# Patient Record
Sex: Female | Born: 1950 | Race: White | Hispanic: No | Marital: Single | Smoking: Former smoker
Health system: Southern US, Community
[De-identification: ages and names within clinical notes are randomized; demographics above are authoritative.]

## PROBLEM LIST (undated history)

## (undated) DIAGNOSIS — E039 Hypothyroidism, unspecified: Secondary | ICD-10-CM

## (undated) DIAGNOSIS — G4730 Sleep apnea, unspecified: Secondary | ICD-10-CM

## (undated) DIAGNOSIS — Z79890 Hormone replacement therapy (postmenopausal): Secondary | ICD-10-CM

## (undated) DIAGNOSIS — G4700 Insomnia, unspecified: Secondary | ICD-10-CM

## (undated) DIAGNOSIS — K219 Gastro-esophageal reflux disease without esophagitis: Secondary | ICD-10-CM

## (undated) HISTORY — DX: Insomnia, unspecified: G47.00

## (undated) HISTORY — DX: Gastro-esophageal reflux disease without esophagitis: K21.9

## (undated) HISTORY — DX: Hypothyroidism, unspecified: E03.9

## (undated) HISTORY — DX: Sleep apnea, unspecified: G47.30

## (undated) HISTORY — DX: Hormone replacement therapy: Z79.890

---

## 1998-03-08 ENCOUNTER — Other Ambulatory Visit: Admission: RE | Admit: 1998-03-08 | Discharge: 1998-03-08 | Payer: Self-pay | Admitting: Gynecology

## 1998-11-28 ENCOUNTER — Other Ambulatory Visit: Admission: RE | Admit: 1998-11-28 | Discharge: 1998-11-28 | Payer: Self-pay | Admitting: Gynecology

## 1998-12-25 ENCOUNTER — Other Ambulatory Visit: Admission: RE | Admit: 1998-12-25 | Discharge: 1998-12-25 | Payer: Self-pay | Admitting: Gynecology

## 1999-01-12 ENCOUNTER — Other Ambulatory Visit: Admission: RE | Admit: 1999-01-12 | Discharge: 1999-01-12 | Payer: Self-pay | Admitting: Gynecology

## 1999-04-25 ENCOUNTER — Other Ambulatory Visit: Admission: RE | Admit: 1999-04-25 | Discharge: 1999-04-25 | Payer: Self-pay | Admitting: Gynecology

## 1999-08-16 ENCOUNTER — Other Ambulatory Visit: Admission: RE | Admit: 1999-08-16 | Discharge: 1999-08-16 | Payer: Self-pay | Admitting: Gynecology

## 1999-12-10 ENCOUNTER — Other Ambulatory Visit: Admission: RE | Admit: 1999-12-10 | Discharge: 1999-12-10 | Payer: Self-pay | Admitting: Gynecology

## 1999-12-18 ENCOUNTER — Other Ambulatory Visit: Admission: RE | Admit: 1999-12-18 | Discharge: 1999-12-18 | Payer: Self-pay | Admitting: Gynecology

## 1999-12-18 ENCOUNTER — Encounter (INDEPENDENT_AMBULATORY_CARE_PROVIDER_SITE_OTHER): Payer: Self-pay | Admitting: Specialist

## 2000-05-08 ENCOUNTER — Other Ambulatory Visit: Admission: RE | Admit: 2000-05-08 | Discharge: 2000-05-08 | Payer: Self-pay | Admitting: Gynecology

## 2000-12-10 ENCOUNTER — Other Ambulatory Visit: Admission: RE | Admit: 2000-12-10 | Discharge: 2000-12-10 | Payer: Self-pay | Admitting: Gynecology

## 2002-03-09 ENCOUNTER — Other Ambulatory Visit: Admission: RE | Admit: 2002-03-09 | Discharge: 2002-03-09 | Payer: Self-pay | Admitting: Gynecology

## 2003-03-08 ENCOUNTER — Other Ambulatory Visit: Admission: RE | Admit: 2003-03-08 | Discharge: 2003-03-08 | Payer: Self-pay | Admitting: Gynecology

## 2003-04-13 ENCOUNTER — Encounter: Payer: Self-pay | Admitting: Gynecology

## 2003-04-13 ENCOUNTER — Ambulatory Visit (HOSPITAL_COMMUNITY): Admission: RE | Admit: 2003-04-13 | Discharge: 2003-04-13 | Payer: Self-pay | Admitting: Gynecology

## 2004-03-20 ENCOUNTER — Other Ambulatory Visit: Admission: RE | Admit: 2004-03-20 | Discharge: 2004-03-20 | Payer: Self-pay | Admitting: Gynecology

## 2004-04-18 ENCOUNTER — Ambulatory Visit (HOSPITAL_COMMUNITY): Admission: RE | Admit: 2004-04-18 | Discharge: 2004-04-18 | Payer: Self-pay | Admitting: Gynecology

## 2005-05-06 ENCOUNTER — Other Ambulatory Visit: Admission: RE | Admit: 2005-05-06 | Discharge: 2005-05-06 | Payer: Self-pay | Admitting: Gynecology

## 2005-05-28 ENCOUNTER — Ambulatory Visit (HOSPITAL_COMMUNITY): Admission: RE | Admit: 2005-05-28 | Discharge: 2005-05-28 | Payer: Self-pay | Admitting: Gynecology

## 2006-04-16 ENCOUNTER — Other Ambulatory Visit: Admission: RE | Admit: 2006-04-16 | Discharge: 2006-04-16 | Payer: Self-pay | Admitting: Family Medicine

## 2006-06-02 ENCOUNTER — Ambulatory Visit (HOSPITAL_COMMUNITY): Admission: RE | Admit: 2006-06-02 | Discharge: 2006-06-02 | Payer: Self-pay | Admitting: Family Medicine

## 2007-01-26 ENCOUNTER — Encounter: Admission: RE | Admit: 2007-01-26 | Discharge: 2007-01-26 | Payer: Self-pay | Admitting: Sports Medicine

## 2007-06-24 ENCOUNTER — Other Ambulatory Visit: Admission: RE | Admit: 2007-06-24 | Discharge: 2007-06-24 | Payer: Self-pay | Admitting: Family Medicine

## 2007-07-07 ENCOUNTER — Ambulatory Visit (HOSPITAL_COMMUNITY): Admission: RE | Admit: 2007-07-07 | Discharge: 2007-07-07 | Payer: Self-pay | Admitting: Family Medicine

## 2008-07-07 ENCOUNTER — Ambulatory Visit (HOSPITAL_COMMUNITY): Admission: RE | Admit: 2008-07-07 | Discharge: 2008-07-07 | Payer: Self-pay | Admitting: Family Medicine

## 2008-09-20 ENCOUNTER — Other Ambulatory Visit: Admission: RE | Admit: 2008-09-20 | Discharge: 2008-09-20 | Payer: Self-pay | Admitting: Family Medicine

## 2009-04-26 ENCOUNTER — Other Ambulatory Visit: Admission: RE | Admit: 2009-04-26 | Discharge: 2009-04-26 | Payer: Self-pay | Admitting: Family Medicine

## 2009-06-21 ENCOUNTER — Encounter (INDEPENDENT_AMBULATORY_CARE_PROVIDER_SITE_OTHER): Payer: Self-pay | Admitting: *Deleted

## 2009-10-17 ENCOUNTER — Ambulatory Visit (HOSPITAL_COMMUNITY): Admission: RE | Admit: 2009-10-17 | Discharge: 2009-10-17 | Payer: Self-pay | Admitting: Family Medicine

## 2009-11-07 ENCOUNTER — Other Ambulatory Visit: Admission: RE | Admit: 2009-11-07 | Discharge: 2009-11-07 | Payer: Self-pay | Admitting: Family Medicine

## 2010-03-13 ENCOUNTER — Telehealth: Payer: Self-pay | Admitting: Gastroenterology

## 2010-09-15 ENCOUNTER — Encounter: Payer: Self-pay | Admitting: Gynecology

## 2010-09-25 NOTE — Progress Notes (Signed)
Summary: Schedule Colonoscopy  Phone Note Outgoing Call Call back at [facility] Phone [phone]   Call placed by: Harlow Mares CMA [facility]),  March 13, 2010 10:11 AM Call placed to: Patient Summary of Call: Patients number is disconnected, we will mail them a letter to remind them they are due for their procedure and they need to call back and schedule.   Initial call taken by: Harlow Mares CMA [facility]),  March 13, 2010 10:11 AM

## 2010-11-05 ENCOUNTER — Other Ambulatory Visit (HOSPITAL_COMMUNITY): Payer: Self-pay | Admitting: Family Medicine

## 2010-11-05 DIAGNOSIS — Z1231 Encounter for screening mammogram for malignant neoplasm of breast: Secondary | ICD-10-CM

## 2010-11-20 ENCOUNTER — Ambulatory Visit (HOSPITAL_COMMUNITY): Payer: BC Managed Care – PPO | Attending: Family Medicine

## 2010-12-12 ENCOUNTER — Other Ambulatory Visit (HOSPITAL_COMMUNITY)
Admission: RE | Admit: 2010-12-12 | Discharge: 2010-12-12 | Disposition: A | Discharge status: Home / Self Care | Payer: BC Managed Care – PPO | Source: Ambulatory Visit | Attending: Family Medicine | Admitting: Family Medicine

## 2010-12-12 ENCOUNTER — Other Ambulatory Visit: Payer: Self-pay | Admitting: Family Medicine

## 2010-12-12 DIAGNOSIS — Z124 Encounter for screening for malignant neoplasm of cervix: Secondary | ICD-10-CM | POA: Insufficient documentation

## 2011-04-10 ENCOUNTER — Ambulatory Visit (HOSPITAL_COMMUNITY)
Admission: RE | Admit: 2011-04-10 | Discharge: 2011-04-10 | Disposition: A | Discharge status: Home / Self Care | Payer: BC Managed Care – PPO | Source: Ambulatory Visit | Attending: Family Medicine | Admitting: Family Medicine

## 2011-04-10 DIAGNOSIS — Z1231 Encounter for screening mammogram for malignant neoplasm of breast: Secondary | ICD-10-CM

## 2011-04-12 ENCOUNTER — Other Ambulatory Visit: Payer: Self-pay | Admitting: Family Medicine

## 2011-04-12 DIAGNOSIS — R928 Other abnormal and inconclusive findings on diagnostic imaging of breast: Secondary | ICD-10-CM

## 2011-04-19 ENCOUNTER — Ambulatory Visit
Admission: RE | Admit: 2011-04-19 | Discharge: 2011-04-19 | Disposition: A | Discharge status: Home / Self Care | Payer: BC Managed Care – PPO | Source: Ambulatory Visit | Attending: Family Medicine | Admitting: Family Medicine

## 2011-04-19 DIAGNOSIS — R928 Other abnormal and inconclusive findings on diagnostic imaging of breast: Secondary | ICD-10-CM

## 2012-01-08 ENCOUNTER — Encounter: Payer: Self-pay | Admitting: Gastroenterology

## 2012-10-29 ENCOUNTER — Other Ambulatory Visit: Payer: Self-pay

## 2012-10-29 DIAGNOSIS — Z1231 Encounter for screening mammogram for malignant neoplasm of breast: Secondary | ICD-10-CM

## 2012-12-02 ENCOUNTER — Other Ambulatory Visit (HOSPITAL_COMMUNITY)
Admission: RE | Admit: 2012-12-02 | Discharge: 2012-12-02 | Disposition: A | Discharge status: Home / Self Care | Payer: BC Managed Care – PPO | Source: Ambulatory Visit | Attending: Family Medicine | Admitting: Family Medicine

## 2012-12-02 ENCOUNTER — Other Ambulatory Visit: Payer: Self-pay | Admitting: Family Medicine

## 2012-12-02 DIAGNOSIS — Z0000 Encounter for general adult medical examination without abnormal findings: Secondary | ICD-10-CM | POA: Insufficient documentation

## 2012-12-04 ENCOUNTER — Ambulatory Visit
Admission: RE | Admit: 2012-12-04 | Discharge: 2012-12-04 | Disposition: A | Discharge status: Home / Self Care | Payer: BC Managed Care – PPO | Source: Ambulatory Visit

## 2012-12-04 DIAGNOSIS — Z1231 Encounter for screening mammogram for malignant neoplasm of breast: Secondary | ICD-10-CM

## 2014-02-02 ENCOUNTER — Other Ambulatory Visit: Payer: Self-pay | Admitting: Family Medicine

## 2014-02-02 ENCOUNTER — Other Ambulatory Visit: Payer: Self-pay

## 2014-02-02 DIAGNOSIS — Z1231 Encounter for screening mammogram for malignant neoplasm of breast: Secondary | ICD-10-CM

## 2014-03-02 ENCOUNTER — Other Ambulatory Visit: Payer: Self-pay | Admitting: Dermatology

## 2014-03-02 ENCOUNTER — Ambulatory Visit: Payer: BC Managed Care – PPO

## 2014-03-04 ENCOUNTER — Ambulatory Visit: Payer: BC Managed Care – PPO

## 2014-04-26 ENCOUNTER — Ambulatory Visit
Admission: RE | Admit: 2014-04-26 | Discharge: 2014-04-26 | Disposition: A | Discharge status: Home / Self Care | Payer: BC Managed Care – PPO | Source: Ambulatory Visit | Attending: Family Medicine | Admitting: Family Medicine

## 2014-04-26 DIAGNOSIS — Z1231 Encounter for screening mammogram for malignant neoplasm of breast: Secondary | ICD-10-CM

## 2014-07-18 ENCOUNTER — Other Ambulatory Visit: Payer: Self-pay | Admitting: Gastroenterology

## 2015-04-04 ENCOUNTER — Other Ambulatory Visit: Payer: Self-pay

## 2015-04-04 DIAGNOSIS — Z1231 Encounter for screening mammogram for malignant neoplasm of breast: Secondary | ICD-10-CM

## 2015-05-29 ENCOUNTER — Ambulatory Visit
Admission: RE | Admit: 2015-05-29 | Discharge: 2015-05-29 | Disposition: A | Discharge status: Home / Self Care | Payer: BLUE CROSS/BLUE SHIELD | Source: Ambulatory Visit

## 2015-05-29 DIAGNOSIS — Z1231 Encounter for screening mammogram for malignant neoplasm of breast: Secondary | ICD-10-CM

## 2016-01-15 ENCOUNTER — Other Ambulatory Visit: Payer: Self-pay | Admitting: Family Medicine

## 2016-01-15 DIAGNOSIS — R5381 Other malaise: Secondary | ICD-10-CM

## 2016-01-29 ENCOUNTER — Other Ambulatory Visit: Payer: Self-pay | Admitting: Family Medicine

## 2016-01-29 DIAGNOSIS — E2839 Other primary ovarian failure: Secondary | ICD-10-CM

## 2016-02-15 ENCOUNTER — Other Ambulatory Visit: Payer: BLUE CROSS/BLUE SHIELD

## 2016-02-20 ENCOUNTER — Ambulatory Visit
Admission: RE | Admit: 2016-02-20 | Discharge: 2016-02-20 | Disposition: A | Discharge status: Home / Self Care | Payer: BLUE CROSS/BLUE SHIELD | Source: Ambulatory Visit | Attending: Family Medicine | Admitting: Family Medicine

## 2016-02-20 DIAGNOSIS — E2839 Other primary ovarian failure: Secondary | ICD-10-CM

## 2016-03-15 ENCOUNTER — Encounter (INDEPENDENT_AMBULATORY_CARE_PROVIDER_SITE_OTHER): Payer: Self-pay | Admitting: Ophthalmology

## 2016-04-11 ENCOUNTER — Encounter (INDEPENDENT_AMBULATORY_CARE_PROVIDER_SITE_OTHER): Payer: BLUE CROSS/BLUE SHIELD | Admitting: Ophthalmology

## 2016-10-03 DIAGNOSIS — H40013 Open angle with borderline findings, low risk, bilateral: Secondary | ICD-10-CM | POA: Diagnosis not present

## 2016-10-03 DIAGNOSIS — M3501 Sicca syndrome with keratoconjunctivitis: Secondary | ICD-10-CM | POA: Diagnosis not present

## 2016-10-03 DIAGNOSIS — H04123 Dry eye syndrome of bilateral lacrimal glands: Secondary | ICD-10-CM | POA: Diagnosis not present

## 2016-10-14 DIAGNOSIS — H43813 Vitreous degeneration, bilateral: Secondary | ICD-10-CM | POA: Diagnosis not present

## 2016-10-14 DIAGNOSIS — H35432 Paving stone degeneration of retina, left eye: Secondary | ICD-10-CM | POA: Diagnosis not present

## 2016-10-14 DIAGNOSIS — H35371 Puckering of macula, right eye: Secondary | ICD-10-CM | POA: Diagnosis not present

## 2016-10-14 DIAGNOSIS — H43393 Other vitreous opacities, bilateral: Secondary | ICD-10-CM | POA: Diagnosis not present

## 2016-10-28 ENCOUNTER — Ambulatory Visit (INDEPENDENT_AMBULATORY_CARE_PROVIDER_SITE_OTHER): Payer: PPO | Admitting: Neurology

## 2016-10-28 ENCOUNTER — Encounter: Payer: Self-pay | Admitting: Neurology

## 2016-10-28 VITALS — BP 108/64 | HR 74 | Resp 14 | Ht 62.0 in | Wt 126.0 lb

## 2016-10-28 DIAGNOSIS — R351 Nocturia: Secondary | ICD-10-CM

## 2016-10-28 DIAGNOSIS — G479 Sleep disorder, unspecified: Secondary | ICD-10-CM | POA: Diagnosis not present

## 2016-10-28 DIAGNOSIS — R0681 Apnea, not elsewhere classified: Secondary | ICD-10-CM

## 2016-10-28 DIAGNOSIS — G2581 Restless legs syndrome: Secondary | ICD-10-CM | POA: Diagnosis not present

## 2016-10-28 DIAGNOSIS — R0683 Snoring: Secondary | ICD-10-CM | POA: Diagnosis not present

## 2016-10-28 NOTE — Patient Instructions (Signed)

## 2016-10-28 NOTE — Progress Notes (Signed)
Subjective:    Patient ID: Wendy Elliott is a 66 y.o. female.  HPI     Star Age, MD, PhD [facility] [address], [address] [place], [place] [address]  Dear Dr. Milana Kidney,    I saw your patient, Wendy Elliott, upon your kind request in my neurologic clinic today for initial consultation of her sleep disorder, in particular, concern for underlying obstructive sleep apnea. The patient is unaccompanied today. As you know, Wendy Elliott is a 66 year old right-handed woman with an underlying medical history of glaucoma, hypothyroidism, and postmenopausal hormone replacement therapy, who reports snoring and excessive daytime somnolence as well as waking up with a sense of choking at night. I reviewed your office note from 10/03/2016, which you kindly included.  Her Epworth sleepiness score is 8 out of 24 today, her fatigue score is 26 out of 63. She reports loud snoring and she has woken herself with a sense of gasping for air. She has been told by boyfriends in the past that she snores loudly. She is not aware of any family history of OSA. She suffers from occasional restless leg symptoms which are annoying, these are not night to night. She is a very restless sleeper, she snores worse on her back, she has nocturia once per night on average. She tries to be in bed between 10 and 11 and her wakeup time 7 AM, she falls asleep okay but does have difficulty staying asleep and often is up after 4 hours and has difficulty going back to sleep. She has tried over-the-counter p.m. type medication or Advil or NyQuil. She has even tried prescription medication including Xanax, clonazepam, trazodone and Ambien, she had side effects with the medications. She is no longer in any sleep aid prescription or otherwise. She drinks alcohol occasionally, usually wine, she does not smoke any longer, quit about 15 years ago, she is divorced for many years, she has no children or pets. She drinks  caffeine occasionally, definitely not daily. 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 She does not like to nap, feels worse after napping.  Her Past Medical History Is Significant For: Past Medical History:  Diagnosis Date  . Hormone replacement therapy   . Hypothyroidism   . Insomnia   . Sleep apnea     Her Past Surgical History Is Significant For: No past surgical history on file.  Her Family History Is Significant For: Family History  Problem Relation Age of Onset  . Osteoporosis Mother     Her Social History Is Significant For: Social History   Social History  . Marital status: Single    Spouse name: N/A  . Number of children: 0  . Years of education: 84   Occupational History  . Self Employeed     Social History Main Topics  . Smoking status: Former Research scientist (life sciences)  . Smokeless tobacco: Never Used     Comment: Quit about 2000  . Alcohol use Yes  . Drug use: No  . Sexual activity: Not Asked   Other Topics Concern  . None   Social History Narrative   Rarely drinks caffeine     Her Allergies Are:  No Known Allergies:   Her Current Medications Are:  Outpatient Encounter Prescriptions as of 10/28/2016  Medication Sig  . fluorometholone (FML) 0.1 % ophthalmic suspension 1 drop every 4 (four) hours.  Marland Kitchen latanoprost (XALATAN) 0.005 % ophthalmic solution 1 drop at bedtime.  Marland Kitchen  Probiotic Product (PROBIOTIC PO) Take by mouth.  . Progesterone 40 % CREA by Does not apply route.  . Testosterone 20 % CREA by Does not apply route.  . [DISCONTINUED] travoprost, benzalkonium, (TRAVATAN) 0.004 % ophthalmic solution 1 drop at bedtime.   No facility-administered encounter medications on file as of 10/28/2016.   :  Review of Systems:  Out of a complete 14 point review of systems, all are reviewed and negative with the exception of these symptoms as listed below: Review of Systems  Neurological:       Patient states that she has trouble  staying asleep after 4 hours, snores, wakes up choking, sometimes wakes up feeling tired, has some daytime fatigue.   Patient has used a mouth guard in the past which helped with the snoring but caused dental issues.    Epworth Sleepiness Scale 0= would never doze 1= slight chance of dozing 2= moderate chance of dozing 3= high chance of dozing  Sitting and reading:3 Watching TV:2 Sitting inactive in a public place (ex. Theater or meeting):1 As a passenger in a car for an hour without a break:1 Lying down to rest in the afternoon:1 Sitting and talking to someone:0 Sitting quietly after lunch (no alcohol):0 In a car, while stopped in traffic:0 Total:8  Objective:  Neurologic Exam  Physical Exam Physical Examination:   Vitals:   10/28/16 1540  BP: 108/64  Pulse: 74  Resp: 14    General Examination: The patient is a very pleasant 66 y.o. female in no acute distress. She appears well-developed and well-nourished and very well groomed.   HEENT: Normocephalic, atraumatic, pupils are equal, round and reactive to light and accommodation. Funduscopic exam is normal with sharp disc margins noted. Extraocular tracking is good without limitation to gaze excursion or nystagmus noted. Normal smooth pursuit is noted. Hearing is grossly intact. Tympanic membranes are clear bilaterally. Face is symmetric with normal facial animation and normal facial sensation. Speech is clear with no dysarthria noted. There is no hypophonia. There is no lip, neck/head, jaw or voice tremor. Neck is supple with full range of passive and active motion. There are no carotid bruits on auscultation. Oropharynx exam reveals: mild mouth dryness, good dental hygiene and dental implants, and mild airway crowding, due to smaller airway entry and thick her uvula. She has mild pharyngeal erythema and does admit to having a raspy throat today, Mallampati is class I. Tongue protrudes centrally and palate elevates symmetrically,  tonsils are small in size, neck is a slender 13-1/4 inch circumference. She has a mild overbite.  Chest: Clear to auscultation without wheezing, rhonchi or crackles noted.  Heart: S1+S2+0, regular and normal without murmurs, rubs or gallops noted.   Abdomen: Soft, non-tender and non-distended with normal bowel sounds appreciated on auscultation.  Extremities: There is no pitting edema in the distal lower extremities bilaterally. Pedal pulses are intact.  Skin: Warm and dry without trophic changes noted.  Musculoskeletal: exam reveals no obvious joint deformities, tenderness or joint swelling or erythema.   Neurologically:  Mental status: The patient is awake, alert and oriented in all 4 spheres. Her immediate and remote memory, attention, language skills and fund of knowledge are appropriate. There is no evidence of aphasia, agnosia, apraxia or anomia. Speech is clear with normal prosody and enunciation. Thought process is linear. Mood is normal and affect is normal.  Cranial nerves II - XII are as described above under HEENT exam. In addition: shoulder shrug is normal with equal shoulder height  noted. Motor exam: Normal bulk, strength and tone is noted. There is no drift, tremor or rebound. Romberg is negative. Reflexes are 2+ throughout. Fine motor skills and coordination: intact with normal finger taps, normal hand movements, normal rapid alternating patting, normal foot taps and normal foot agility.  Cerebellar testing: No dysmetria or intention tremor on finger to nose testing. Heel to shin is unremarkable bilaterally. There is no truncal or gait ataxia.  Sensory exam: intact to light touch, vibration, temperature sense in the upper and lower extremities.  Gait, station and balance: She stands easily. No veering to one side is noted. No leaning to one side is noted. Posture is age-appropriate and stance is narrow based. Gait shows normal stride length and decreased pace. No problems turning  are noted. Tandem walk is unremarkable.  Assessment and Plan:  In summary, Wendy Elliott is a very pleasant 66 y.o.-year old female with an underlying medical history of glaucoma, hypothyroidism, and postmenopausal hormone replacement therapy, whose history and physical exam are concerning for obstructive sleep apnea (OSA), In that she reports restless sleep, nonrestorative sleep, sleep maintenance issues, loud snoring and waking up with a sense of gasping for air, and nocturia. I had a long chat with the patient about my findings and the diagnosis of OSA, its prognosis and treatment options. We talked about medical treatments, surgical interventions and non-pharmacological approaches. I explained in particular the risks and ramifications of untreated moderate to severe OSA, especially with respect to developing cardiovascular disease down the Road, including congestive heart failure, difficult to treat hypertension, cardiac arrhythmias, or stroke. Even type 2 diabetes has, in part, been linked to untreated OSA. Symptoms of untreated OSA include daytime sleepiness, memory problems, mood irritability and mood disorder such as depression and anxiety, lack of energy, as well as recurrent headaches, especially morning headaches. We talked about trying to maintain a healthy lifestyle in general, as well as the importance of weight control, she does not need to lose weight at this time. I advised the patient not to drive when feeling sleepy.  I recommended the following at this time: sleep study with potential positive airway pressure titration. (We will score hypopneas at 4%).   I explained the sleep test procedure to the patient and also outlined possible surgical and non-surgical treatment options of OSA, including the use of a custom-made dental device (which would require a referral to a specialist dentist or oral surgeon), upper airway surgical options, such as pillar implants, radiofrequency surgery, tongue  base surgery, and UPPP (which would involve a referral to an ENT surgeon). Rarely, jaw surgery such as mandibular advancement may be considered. Of note, she had issues with an over-the-counter oral appliance and had dental implants, her current dentist advised her against a oral appliance for treatment of snoring or sleep apnea. I also explained the CPAP treatment option to the patient, who indicated that she would be willing to try CPAP if the need arises. I explained the importance of being compliant with PAP treatment, not only for insurance purposes but primarily to improve Her symptoms, and for the patient's long term health benefit, including to reduce Her cardiovascular risks. I answered all her questions today and the patient was in agreement. I would like to see her back after the sleep study is completed and encouraged her to call with any interim questions, concerns, problems or updates.   Thank you very much for allowing me to participate in the care of this nice patient. If I can  be of any further assistance to you please do not hesitate to call me at [phone].  Sincerely,   Star Age, MD, PhD

## 2016-11-20 DIAGNOSIS — H40003 Preglaucoma, unspecified, bilateral: Secondary | ICD-10-CM | POA: Diagnosis not present

## 2016-12-17 ENCOUNTER — Encounter: Payer: Self-pay | Admitting: Neurology

## 2016-12-26 DIAGNOSIS — H40003 Preglaucoma, unspecified, bilateral: Secondary | ICD-10-CM | POA: Diagnosis not present

## 2016-12-26 DIAGNOSIS — H2512 Age-related nuclear cataract, left eye: Secondary | ICD-10-CM | POA: Diagnosis not present

## 2016-12-26 DIAGNOSIS — H4051X2 Glaucoma secondary to other eye disorders, right eye, moderate stage: Secondary | ICD-10-CM | POA: Diagnosis not present

## 2016-12-26 DIAGNOSIS — Z961 Presence of intraocular lens: Secondary | ICD-10-CM | POA: Diagnosis not present

## 2016-12-26 DIAGNOSIS — H40009 Preglaucoma, unspecified, unspecified eye: Secondary | ICD-10-CM | POA: Diagnosis not present

## 2016-12-26 DIAGNOSIS — H40001 Preglaucoma, unspecified, right eye: Secondary | ICD-10-CM | POA: Diagnosis not present

## 2016-12-26 DIAGNOSIS — Z9841 Cataract extraction status, right eye: Secondary | ICD-10-CM | POA: Diagnosis not present

## 2017-01-01 DIAGNOSIS — L258 Unspecified contact dermatitis due to other agents: Secondary | ICD-10-CM | POA: Diagnosis not present

## 2017-01-27 ENCOUNTER — Ambulatory Visit (INDEPENDENT_AMBULATORY_CARE_PROVIDER_SITE_OTHER): Payer: PPO | Admitting: Neurology

## 2017-01-27 DIAGNOSIS — G4720 Circadian rhythm sleep disorder, unspecified type: Secondary | ICD-10-CM

## 2017-01-27 DIAGNOSIS — G4733 Obstructive sleep apnea (adult) (pediatric): Secondary | ICD-10-CM | POA: Diagnosis not present

## 2017-01-29 NOTE — Progress Notes (Signed)
Patient referred by Dr. Milana Kidney, seen by me on 10/28/16, diagnostic PSG on 01/27/17.    Please call and notify the patient that the recent sleep study did confirm the diagnosis of Moderate obstructive sleep apnea and that I recommend treatment for this in the form of CPAP. This will require a repeat sleep study for proper titration and mask fitting. Please explain to patient and arrange for a CPAP titration study. I have placed an order in the chart. Thanks, and please route to [facility] for scheduling next sleep study.  Star Age, MD, PhD [facility])

## 2017-01-29 NOTE — Procedures (Signed)
PATIENT'S NAME:  Wendy Elliott, Wendy Elliott DOB:      27-Jan-1951      MR#:    [id number]     DATE OF RECORDING: 01/27/2017 REFERRING M.D.:  Thora Lance, MD Study Performed:   Baseline Polysomnogram HISTORY: 66 year old woman with a history of glaucoma, hypothyroidism, and postmenopausal hormone replacement therapy, who reports snoring and excessive daytime somnolence as well as waking up with a sense of choking at night. The patient endorsed the Epworth Sleepiness Scale at 8/24 points. The patient's weight 126 pounds with a height of 62 (inches), resulting in a BMI of 23.1 kg/m2. The patient's neck circumference measured 13 inches.  CURRENT MEDICATIONS: FML, Xalatan, Probiotic, Progesterone, Testosterone.   PROCEDURE:  This is a multichannel digital polysomnogram utilizing the Somnostar 11.2 system.  Electrodes and sensors were applied and monitored per AASM Specifications.   EEG, EOG, Chin and Limb EMG, were sampled at 200 Hz.  ECG, Snore and Nasal Pressure, Thermal Airflow, Respiratory Effort, CPAP Flow and Pressure, Oximetry was sampled at 50 Hz. Digital video and audio were recorded.      BASELINE STUDY  Lights Out was at 22:52 and Lights On at 05:18.  Total recording time (TRT) was 386.5 minutes, with a total sleep time (TST) of  251.5 minutes. The patient's sleep latency was 13 minutes, which is normal. REM latency was 88 minutes, which is normal. The sleep efficiency was 65.1 %, which is reduced.     SLEEP ARCHITECTURE: WASO (Wake after sleep onset) was 104 minutes with moderate sleep fragmentation noted and one longer period of wakefulness. There were 27.5 minutes in Stage N1, 110 minutes Stage N2, 89 minutes Stage N3 and 25 minutes in Stage REM.  The percentage of Stage N1 was 10.9%, which is increased, Stage N2 was 43.7%, Stage N3 was 35.4%, which is increased, and Stage R (REM sleep) was 9.9% with is reduced.  The arousals were noted as: 38 were spontaneous, 4 were associated with PLMs, 26 were  associated with respiratory events.  Audio and video analysis did not show any abnormal or unusual movements, behaviors, phonations or vocalizations. The patient took no bathroom breaks. Mild to moderate snoring was noted. The EKG was in keeping with normal sinus rhythm (NSR).  RESPIRATORY ANALYSIS:  There were a total of 64 respiratory events:  7 obstructive apneas, 33 central apneas and 0 mixed apneas with a total of 40 apneas and an apnea index (AI) of 9.5 /hour. There were 24 hypopneas with a hypopnea index of 5.7 /hour. The patient also had 1 respiratory event related arousals (RERAs).      The total APNEA/HYPOPNEA INDEX (AHI) was 15.3/hour and the total RESPIRATORY DISTURBANCE INDEX was 15.5 /hour.  8 events occurred in REM sleep and 75 events in NREM. The REM AHI was 19.2 /hour, versus a non-REM AHI of 14.8. The patient spent 75 minutes of total sleep time in the supine position and 177 minutes in non-supine.. The supine AHI was 20.8 versus a non-supine AHI of 12.9.  OXYGEN SATURATION & C02:  The Wake baseline 02 saturation was 96%, with the lowest being 85%. Time spent below 89% saturation equaled 10 minutes.  PERIODIC LIMB MOVEMENTS: The patient had a total of 20 Periodic Limb Movements.  The Periodic Limb Movement (PLM) index was 4.8 and the PLM Arousal index was 1./hour.  Post-study, the patient indicated that sleep was the same as usual.   IMPRESSION:  1. Obstructive Sleep Apnea (OSA) 2. Dysfunctions associated with sleep stages or  arousal from sleep  RECOMMENDATIONS:  1. This study demonstrates moderate obstructive sleep apnea, with a total AHI of 15.3/hour, REM AHI of 19.2/hour, supine AHI of 20.8/hour and O2 nadir of 85%. Treatment with positive airway pressure in the form of CPAP is recommended. This will require a full night titration study to optimize therapy. Other treatment options may include avoidance of supine sleep position along with weight loss, upper airway or jaw  surgery in selected patients or the use of an oral appliance in certain patients. ENT evaluation and/or consultation with a maxillofacial surgeon or dentist may be feasible in some instances.    2. Please note that untreated obstructive sleep apnea carries additional perioperative morbidity. Patients with significant obstructive sleep apnea should receive perioperative PAP therapy and the surgeons and particularly the anesthesiologist should be informed of the diagnosis and the severity of the sleep disordered breathing. 3. This study shows sleep fragmentation and abnormal sleep stage percentages; these are nonspecific findings and per se do not signify an intrinsic sleep disorder or a cause for the patient's sleep-related symptoms. Causes include (but are not limited to) the first night effect of the sleep study, circadian rhythm disturbances, medication effect or an underlying mood disorder or medical problem.  4. The patient should be cautioned not to drive, work at heights, or operate dangerous or heavy equipment when tired or sleepy. Review and reiteration of good sleep hygiene measures should be pursued with any patient. 5. The patient will be seen in follow-up by Dr. Rexene Alberts at [facility] for discussion of the test results and further management strategies. The referring provider will be notified of the test results.  I certify that I have reviewed the entire raw data recording prior to the issuance of this report in accordance with the Standards of Accreditation of the American Academy of Sleep Medicine (AASM)    Star Age, MD, PhD Diplomat, American Board of Psychiatry and Neurology (Neurology and Sleep Medicine)

## 2017-01-29 NOTE — Addendum Note (Signed)
Addended by: Star Age on: 01/29/2017 08:29 AM   Modules accepted: Orders

## 2017-01-30 ENCOUNTER — Telehealth: Payer: Self-pay

## 2017-01-30 NOTE — Telephone Encounter (Signed)
Noted, thanks!

## 2017-01-30 NOTE — Telephone Encounter (Signed)
  Spoke with patient and gve her results of her sleep study. Explained Dr. Rexene Alberts recommended for her to come back to sleep lab and be placed on Cpap. I explained procedure and process of treatment. Patient understood. I scheduled her cpap for June 21 at 9:30pm.

## 2017-02-13 ENCOUNTER — Ambulatory Visit (INDEPENDENT_AMBULATORY_CARE_PROVIDER_SITE_OTHER): Payer: PPO | Admitting: Neurology

## 2017-02-13 DIAGNOSIS — G4733 Obstructive sleep apnea (adult) (pediatric): Secondary | ICD-10-CM

## 2017-02-13 DIAGNOSIS — H40003 Preglaucoma, unspecified, bilateral: Secondary | ICD-10-CM | POA: Diagnosis not present

## 2017-02-13 DIAGNOSIS — H43391 Other vitreous opacities, right eye: Secondary | ICD-10-CM | POA: Diagnosis not present

## 2017-02-13 DIAGNOSIS — H25812 Combined forms of age-related cataract, left eye: Secondary | ICD-10-CM | POA: Diagnosis not present

## 2017-02-13 DIAGNOSIS — Z961 Presence of intraocular lens: Secondary | ICD-10-CM | POA: Diagnosis not present

## 2017-02-13 DIAGNOSIS — G4720 Circadian rhythm sleep disorder, unspecified type: Secondary | ICD-10-CM

## 2017-02-14 NOTE — Procedures (Signed)
PATIENT'S NAME:  Wendy Elliott, Wendy Elliott DOB:      Jul 15, 1951      MR#:    [id number]     DATE OF RECORDING: 02/13/2017 REFERRING M.D.:  Thora Lance, MD Study Performed:   CPAP  Titration HISTORY:  66 year old right-handed woman with an underlying medical history of glaucoma, hypothyroidism, and postmenopausal hormone replacement therapy, who returns for full night CPAP titration to treat her OSA.  Her baseline PSG from 01/27/2017 showed an AHI of 15.3 and low spo2 of 85%. The patient endorsed the Epworth Sleepiness Scale at 8 points, BMI of 23.1 kg/m2. The patient's neck circumference measured 13 inches.  CURRENT MEDICATIONS: FML, Xalatan, Probiotic, Progesterone, Testosterone.  PROCEDURE:  This is a multichannel digital polysomnogram utilizing the SomnoStar 11.2 system.  Electrodes and sensors were applied and monitored per AASM Specifications.   EEG, EOG, Chin and Limb EMG, were sampled at 200 Hz.  ECG, Snore and Nasal Pressure, Thermal Airflow, Respiratory Effort, CPAP Flow and Pressure, Oximetry was sampled at 50 Hz. Digital video and audio were recorded.      The patient was fitted with small P10 nasal pillows. CPAP was initiated at 5 cmH20 with heated humidity per AASM standards and pressure was advanced to 7 cmH20 because of hypopneas, apneas and desaturations.  At a PAP pressure of 7 cmH20, there was a reduction of the AHI to 0 with O2 nadir of 91%, non-supine REM sleep achieved.     Lights Out was at 22:42 and Lights On at 04:59. Total recording time (TRT) was 378 minutes, with a total sleep time (TST) of 312.5 minutes. The patient's sleep latency was 26.5 minutes, REM latency was 62 minutes, which is mildly reduced. The sleep efficiency was 82.7 %.    SLEEP ARCHITECTURE: WASO (Wake after sleep onset)  was 44 minutes with mild to moderate sleep fragmentation noted. There were 28.5 minutes in Stage N1, 77 minutes Stage N2, 150.5 minutes Stage N3 and 56.5 minutes in Stage REM.  The percentage of Stage  N1 was 9.1%, which is increased, Stage N2 was 24.6%, Stage N3 was 48.2%, which is increased, and Stage R (REM sleep) was 18.1%, which is near normal. [] The sleep architecture was notable for ?????. The arousals were noted as: 28 were spontaneous, 8 were associated with PLMs, 0 were associated with respiratory events.  [x] Audio and video analysis did not show any abnormal or unusual movements, behaviors, phonations or vocalizations. [x]  The patient took no bathroom breaks. [x] The EKG was in keeping with normal sinus rhythm (NSR).  RESPIRATORY ANALYSIS:  There was a total of 0 respiratory events: 0 obstructive apneas, 0 central apneas and 0 mixed apneas with a total of 0 apneas and an apnea index (AI) of 0 /hour. There were 0 hypopneas with a hypopnea index of 0/hour. The patient also had 0 respiratory event related arousals (RERAs).      The total APNEA/HYPOPNEA INDEX  (AHI) was 0 /hour and the total RESPIRATORY DISTURBANCE INDEX was 0 .hour  0 events occurred in REM sleep and 0 events in NREM. The REM AHI was 0 /hour versus a non-REM AHI of 0 /hour.  The patient spent 87.5 minutes of total sleep time in the supine position and 225 minutes in non-supine. The supine AHI was 0.0, versus a non-supine AHI of 0.0.  OXYGEN SATURATION & C02:  The baseline 02 saturation was 96%, with the lowest being 88%. Time spent below 89% saturation equaled 0 minutes.  PERIODIC LIMB MOVEMENTS:  The patient  had a total of 18 Periodic Limb Movements. The Periodic Limb Movement (PLM) index was 3.5 and the PLM Arousal index was 1.5 /hour. [x]   Post-study, the patient indicated that sleep was better than usual.   IMPRESSION: 1. Obstructive Sleep Apnea (OSA) 2. Dysfunctions associated with sleep stages or arousal from sleep   RECOMMENDATIONS: 1. This study demonstrates resolution of the patient's obstructive sleep apnea with CPAP therapy. I will, therefore, start the patient on home CPAP treatment at a pressure of 7 cm via  small nasal pillows with heated humidity. The patient should be reminded to be fully compliant with PAP therapy to improve sleep related symptoms and decrease long term cardiovascular risks. The patient should be reminded, that it may take up to 3 months to get fully used to using PAP with all planned sleep. The earlier full compliance is achieved, the better long term compliance tends to be. Please note that untreated obstructive sleep apnea carries additional perioperative morbidity. Patients with significant obstructive sleep apnea should receive perioperative PAP therapy and the surgeons and particularly the anesthesiologist should be informed of the diagnosis and the severity of the sleep disordered breathing. 2. Other treatment options for OSA may include upper airway or jaw surgery in selected patients or the use of an oral appliance in certain patients. ENT evaluation and/or consultation with a maxillofacial surgeon or dentist may be feasible in some instances.    3. This study shows sleep fragmentation and abnormal sleep stage percentages; these are nonspecific findings and per se do not signify an intrinsic sleep disorder or a cause for the patient's sleep-related symptoms. Causes include (but are not limited to) the first night effect of the sleep study, circadian rhythm disturbances, medication effect or an underlying mood disorder or medical problem.  4. The patient should be cautioned not to drive, work at heights, or operate dangerous or heavy equipment when tired or sleepy. Review and reiteration of good sleep hygiene measures should be pursued with any patient. 5. The patient will be seen in follow-up by Dr. Rexene Alberts at [facility] for discussion of the test results and further management strategies. The referring provider will be notified of the test results.   I certify that I have reviewed the entire raw data recording prior to the issuance of this report in accordance with the Standards of  Accreditation of the American Academy of Sleep Medicine (AASM)     Star Age, MD, PhD Diplomat, American Board of Psychiatry and Neurology (Neurology and Sleep Medicine)

## 2017-02-14 NOTE — Addendum Note (Signed)
Addended by: Star Age on: 02/14/2017 12:58 PM   Modules accepted: Orders

## 2017-02-14 NOTE — Progress Notes (Signed)
Patient referred by Dr. Milana Kidney, seen by me on 10/28/16, diagnostic PSG on 01/27/17, cpap study on 02/13/17.     Please call and inform patient that I have entered an order for treatment with positive airway pressure (PAP) treatment of obstructive sleep apnea (OSA). She did well during the latest sleep study with CPAP. We will, therefore, arrange for a machine for home use through a DME (durable medical equipment) company of Her choice; and I will see the patient back in follow-up in about 10 weeks (can see one of our Nurse Pract). Please also explain to the patient that I will be looking out for compliance data, which can be downloaded from the machine (stored on an SD card, that is inserted in the machine) or via remote access through a modem, that is built into the machine. At the time of the followup appointment we will discuss sleep study results and how it is going with PAP treatment at home. Please advise patient to bring Her machine at the time of the first FU visit, even though this is cumbersome. Bringing the machine for every visit after that will likely not be needed, but often helps for the first visit to troubleshoot if needed. Please re-enforce the importance of compliance with treatment and the need for [place] to monitor compliance data - often an insurance requirement and actually good feedback for the patient as far as how they are doing.  Also remind patient, that any interim PAP machine or mask issues should be first addressed with the DME company, as they can often help better with technical and mask fit issues. Please ask if patient has a preference regarding DME company.  Please also make sure, the patient has a follow-up appointment with me or MM or CM in about 10 weeks from the setup date, thanks.  Once you have spoken to the patient - and faxed/routed report to PCP and referring MD (if other than PCP), you can close this encounter, thanks,   Star Age, MD, PhD [facility] ([place])

## 2017-02-18 ENCOUNTER — Telehealth: Payer: Self-pay

## 2017-02-18 NOTE — Telephone Encounter (Signed)
I called pt. I advised pt that Dr. Rexene Alberts reviewed their sleep study results and found that pt did well during the latest sleep study with cpap. Dr. Rexene Alberts recommends that pt start a cpap at home. I reviewed PAP compliance expectations with the pt. Pt is agreeable to starting a CPAP. I advised pt that an order will be sent to a DME, [employer], and [employer] will call the pt within about one week after they file with the pt's insurance. [employer] will show the pt how to use the machine, fit for masks, and troubleshoot the CPAP if needed. A follow up appt was made for insurance purposes with Dr. Rexene Alberts on 04/30/17 at 3:00pm. Pt verbalized understanding to arrive 15 minutes early and bring their CPAP. A letter with all of this information in it will be mailed to the pt as a reminder. I verified with the pt that the address we have on file is correct. Pt verbalized understanding of results. Pt had no questions at this time but was encouraged to call back if questions arise.

## 2017-02-18 NOTE — Telephone Encounter (Signed)
-----   Message from Star Age, MD sent at 02/14/2017 12:58 PM EDT ----- Patient referred by Dr. Milana Kidney, seen by me on 10/28/16, diagnostic PSG on 01/27/17, cpap study on 02/13/17.     Please call and inform patient that I have entered an order for treatment with positive airway pressure (PAP) treatment of obstructive sleep apnea (OSA). She did well during the latest sleep study with CPAP. We will, therefore, arrange for a machine for home use through a DME (durable medical equipment) company of Her choice; and I will see the patient back in follow-up in about 10 weeks (can see one of our Nurse Pract). Please also explain to the patient that I will be looking out for compliance data, which can be downloaded from the machine (stored on an SD card, that is inserted in the machine) or via remote access through a modem, that is built into the machine. At the time of the followup appointment we will discuss sleep study results and how it is going with PAP treatment at home. Please advise patient to bring Her machine at the time of the first FU visit, even though this is cumbersome. Bringing the machine for every visit after that will likely not be needed, but often helps for the first visit to troubleshoot if needed. Please re-enforce the importance of compliance with treatment and the need for [place] to monitor compliance data - often an insurance requirement and actually good feedback for the patient as far as how they are doing.  Also remind patient, that any interim PAP machine or mask issues should be first addressed with the DME company, as they can often help better with technical and mask fit issues. Please ask if patient has a preference regarding DME company.  Please also make sure, the patient has a follow-up appointment with me or MM or CM in about 10 weeks from the setup date, thanks.  Once you have spoken to the patient - and faxed/routed report to PCP and referring MD (if other than PCP), you can close this  encounter, thanks,   Star Age, MD, PhD [facility] ([place])

## 2017-03-13 DIAGNOSIS — R5383 Other fatigue: Secondary | ICD-10-CM | POA: Diagnosis not present

## 2017-03-13 DIAGNOSIS — Z79890 Hormone replacement therapy (postmenopausal): Secondary | ICD-10-CM | POA: Diagnosis not present

## 2017-03-18 DIAGNOSIS — E039 Hypothyroidism, unspecified: Secondary | ICD-10-CM | POA: Diagnosis not present

## 2017-03-24 DIAGNOSIS — G4733 Obstructive sleep apnea (adult) (pediatric): Secondary | ICD-10-CM | POA: Diagnosis not present

## 2017-04-04 DIAGNOSIS — J310 Chronic rhinitis: Secondary | ICD-10-CM | POA: Diagnosis not present

## 2017-04-04 DIAGNOSIS — G4733 Obstructive sleep apnea (adult) (pediatric): Secondary | ICD-10-CM | POA: Diagnosis not present

## 2017-04-24 DIAGNOSIS — G4733 Obstructive sleep apnea (adult) (pediatric): Secondary | ICD-10-CM | POA: Diagnosis not present

## 2017-04-30 ENCOUNTER — Encounter: Payer: Self-pay | Admitting: Neurology

## 2017-04-30 ENCOUNTER — Ambulatory Visit (INDEPENDENT_AMBULATORY_CARE_PROVIDER_SITE_OTHER): Payer: PPO | Admitting: Neurology

## 2017-04-30 VITALS — BP 120/76 | HR 64 | Ht 62.0 in | Wt 126.0 lb

## 2017-04-30 DIAGNOSIS — G4733 Obstructive sleep apnea (adult) (pediatric): Secondary | ICD-10-CM | POA: Diagnosis not present

## 2017-04-30 DIAGNOSIS — Z789 Other specified health status: Secondary | ICD-10-CM | POA: Diagnosis not present

## 2017-04-30 NOTE — Progress Notes (Signed)
Subjective:    Patient ID: Wendy Elliott is a 66 y.o. female.  HPI     Interim history:   Wendy Elliott is a 66 year old right-handed woman with an underlying medical history of glaucoma, hypothyroidism, and postmenopausal hormone replacement therapy, who presents for follow-up consultation of her obstructive sleep apnea, after her recent sleep study testing. The patient is unaccompanied today. I first met her on 10/28/2016 at the request of her primary care physician, at which time she reported snoring, waking up with a sense of gasping or choking at night and daytime somnolence. I suggested we proceed with a sleep study. She had a baseline sleep study, followed by a CPAP titration study. I went over her test results with her in detail today. Baseline sleep study from 01/27/2017 showed a sleep latency of 13 minutes, REM latency of 88 minutes and sleep efficiency reduced at 65.1%. She had a significant amount of wake after sleep onset with moderate sleep fragmentation noted and one longer period of wakefulness. She had a reduced percentage of REM sleep and an increased percentage of stage I sleep and increased percentage of slow-wave sleep. Total AHI was in the moderate range at 15.3 per hour, REM AHI 19.2 per hour, supine AHI 20.8 per hour. Average oxygen saturation 96%, nadir was 85%. She had no significant PLMS or EKG or EEG changes. Based on her moderate obstructive sleep apnea and sleep related complaints I suggested we proceed with a full night CPAP titration study. She had this on 02/13/2017. Sleep latency was 26.5 minutes, REM latency 62 minutes, sleep efficiency 82.7%. She had a near normal percentage of REM sleep and slow-wave sleep was increased. She was fitted with a small nasal pillows interface and CPAP was titrated from 5 cm to 7 cm. On the final pressure her AHI was 0 per hour, O2 nadir of 91% with nonsupine REM sleep achieved. Average oxygen saturations of the night was 96%, nadir was 88%.  She had no significant PLMS, EKG or EEG changes. Based on her test results I prescribed CPAP therapy for home use at a pressure of 7 cm.  Today, 04/30/2017 (all dictated new, as well as above notes, some dictation done in note pad or Word, outside of chart, may appear as copied):  I reviewed her CPAP compliance data from 03/26/2017 through 04/24/2017 which is a total of 30 days, during which time she used her machine only 18 days with percent used days greater than 4 hours at 40% only, indicating suboptimal compliance with an average usage of 4 hours and 51 minutes on the days of treatment, residual AHI 7.4 per hour which is suboptimal, leak acceptable with the 95th percentile at 14.6 L/m on a pressure of 7 cm with EPR of 3. She reports having struggled with CPAP. The nasal pillows do not tend to stay in place and she does not necessarily feel better sleeping with it. It does not cause her any pain but she is not able to continue with treatment consistently. She is interested in treatment with an oral appliance. She has reached out to Dr. Corky Sing office for this. She has not seen Dr. Toy Cookey herself yet. She has talked her own dentist about this as well.  The patient's allergies, current medications, family history, past medical history, past social history, past surgical history and problem list were reviewed and updated as appropriate.   Previously (copied from previous notes for reference):   10/28/2016: (She) reports snoring and excessive daytime somnolence as well  as waking up with a sense of choking at night. I reviewed your office note from 10/03/2016, which you kindly included.  Her Epworth sleepiness score is 8 out of 24 today, her fatigue score is 26 out of 63. She reports loud snoring and she has woken herself with a sense of gasping for air. She has been told by boyfriends in the past that she snores loudly. She is not aware of any family history of OSA. She suffers from occasional restless leg  symptoms which are annoying, these are not night to night. She is a very restless sleeper, she snores worse on her back, she has nocturia once per night on average. She tries to be in bed between 10 and 11 and her wakeup time 7 AM, she falls asleep okay but does have difficulty staying asleep and often is up after 4 hours and has difficulty going back to sleep. She has tried over-the-counter p.m. type medication or Advil or NyQuil. She has even tried prescription medication including Xanax, clonazepam, trazodone and Ambien, she had side effects with the medications. She is no longer in any sleep aid prescription or otherwise. She drinks alcohol occasionally, usually wine, she does not smoke any longer, quit about 15 years ago, she is divorced for many years, she has no children or pets. She drinks caffeine occasionally, definitely not daily. 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 She does not like to nap, feels worse after napping.  Her Past Medical History Is Significant For: Past Medical History:  Diagnosis Date  . Hormone replacement therapy   . Hypothyroidism   . Insomnia   . Sleep apnea     Her Past Surgical History Is Significant For: No past surgical history on file.  Her Family History Is Significant For: Family History  Problem Relation Age of Onset  . Osteoporosis Mother     Her Social History Is Significant For: Social History   Social History  . Marital status: Single    Spouse name: N/A  . Number of children: 0  . Years of education: 13   Occupational History  . Self Employeed     Social History Main Topics  . Smoking status: Former Smoker  . Smokeless tobacco: Never Used     Comment: Quit about 2000  . Alcohol use Yes  . Drug use: No  . Sexual activity: Not Asked   Other Topics Concern  . None   Social History Narrative   Rarely drinks caffeine     Her Allergies Are:  No Known Allergies:   Her Current  Medications Are:  Outpatient Encounter Prescriptions as of 04/30/2017  Medication Sig  . Estradiol (ESTRACE VA) Place vaginally.  . estradiol (ESTRACE) 0.1 MG/GM vaginal cream Place 1 Applicatorful vaginally at bedtime.  . fluorometholone (FML) 0.1 % ophthalmic suspension 1 drop every 4 (four) hours.  . Probiotic Product (PROBIOTIC PO) Take by mouth.  . Progesterone 40 % CREA by Does not apply route.  . Testosterone 20 % CREA by Does not apply route.  . Travoprost, BAK Free, (TRAVATAN Z) 0.004 % SOLN ophthalmic solution Travatan Z 0.004 % eye drops  PLACE 1 DROP INTO RIGHT EYE QHS  . [DISCONTINUED] latanoprost (XALATAN) 0.005 % ophthalmic solution 1 drop at bedtime.   No facility-administered encounter medications on file as of 04/30/2017.   :  Review of Systems:  Out of a complete 14 point review of systems, all are reviewed and negative   with the exception of these symptoms as listed below: Review of Systems  Neurological:       Pt presents today to discuss her cpap. Pt reports that her cpap is cumbersome and she is interested in doing the oral appliance instead.    Objective:  Neurological Exam  Physical Exam Physical Examination:   Vitals:   04/30/17 1449  BP: 120/76  Pulse: 64   General Examination: The patient is a very pleasant 66 y.o. female in no acute distress. She appears well-developed and well-nourished and very well groomed.   HEENT: Normocephalic, atraumatic, pupils are equal, round and reactive to light and accommodation. Extraocular tracking is good without limitation to gaze excursion or nystagmus noted. Normal smooth pursuit is noted. Hearing is grossly intact. Face is symmetric with normal facial animation and normal facial sensation. Speech is clear with no dysarthria noted. There is no hypophonia. There is no lip, neck/head, jaw or voice tremor. Neck is supple with full range of passive and active motion. There are no carotid bruits on auscultation. Oropharynx  exam reveals: mild mouth dryness, good dental hygiene and dental implants, and mild airway crowding, due to smaller airway entry and thicker uvula. She has mild pharyngeal erythema, but not different from last time. Mallampati is class I. Tongue protrudes centrally and palate elevates symmetrically, tonsils are small in size, neck is slender. She has a mild overbite.  Chest: Clear to auscultation without wheezing, rhonchi or crackles noted.  Heart: S1+S2+0, regular and normal without murmurs, rubs or gallops noted.   Abdomen: Soft, non-tender and non-distended with normal bowel sounds appreciated on auscultation.  Extremities: There is no pitting edema in the distal lower extremities bilaterally. Pedal pulses are intact.  Skin: Warm and dry without trophic changes noted.  Musculoskeletal: exam reveals no obvious joint deformities, tenderness or joint swelling or erythema.   Neurologically:  Mental status: The patient is awake, alert and oriented in all 4 spheres. Her immediate and remote memory, attention, language skills and fund of knowledge are appropriate. There is no evidence of aphasia, agnosia, apraxia or anomia. Speech is clear with normal prosody and enunciation. Thought process is linear. Mood is normal and affect is normal.  Cranial nerves II - XII are as described above under HEENT exam.  Motor exam: Normal bulk, strength and tone is noted. There is no drift, tremor or rebound. Romberg is negative. Reflexes are 2+ throughout. Fine motor skills and coordination: grossly intact.  Cerebellar testing: No dysmetria or intention tremor. There is no truncal or gait ataxia.  Sensory exam: intact to light touch in the upper and lower extremities.  Gait, station and balance: She stands easily. No veering to one side is noted. No leaning to one side is noted. Posture is age-appropriate and stance is narrow based. Gait shows normal stride length and decreased pace. No problems turning are  noted. Tandem walk is unremarkable.  Assessment and Plan:  In summary, Siobahn Worsley is a very pleasant 66 year old female with an underlying medical history of glaucoma, hypothyroidism, and postmenopausal hormone replacement therapy, who Presents for follow-up consultation of her obstructive sleep apnea, after starting a trial of CPAP therapy. She had a baseline sleep study and a CPAP titration study recently, both in June 2018. Her diagnostic sleep study indicated borderline moderate obstructive sleep apnea with an overall AHI of 15.3 per hour, O2 nadir of 85%. While she did have good results with CPAP of 7 cm during her titration study. She has struggled with  CPAP after starting treatment a little over a month ago or so. She has not felt to telltale improvement, she has also struggled with keeping the interface in place at night and does not realize that the nasal pillows come off at night. She is interested in pursuing an oral appliance if possible. She has reached out to Dr. Fuller's office but has talked to her assistant, has not met Dr. Fuller as yet. We mutually agreed to make a referral to our office for consideration of an oral appliance for sleep apnea. She is encouraged to reach out to her DME company as far as returning her CPAP machine. We can certainly revisit the need for CPAP if she would like in the future. She may have to purchase her current CPAP machine which is a possibility for her but I suggested she discuss this with her DME provider at this time. We mutually agreed for an as needed follow-up with me at this time. Physical exam is stable. We talked about her sleep study results in detail and also reviewed her compliance data together. I answered all her questions today and she was in agreement with the plan.  I spent 25 minutes in total face-to-face time with the patient, more than 50% of which was spent in counseling and coordination of care, reviewing test results, reviewing  medication and discussing or reviewing the diagnosis of OSA, its prognosis and treatment options. Pertinent laboratory and imaging test results that were available during this visit with the patient were reviewed by me and considered in my medical decision making (see chart for details).   

## 2017-04-30 NOTE — Patient Instructions (Addendum)
I appreciate you trying CPAP, as your initial treatment option for obstructive sleep apnea. As discussed, your sleep study results from early June 2018 indicated borderline moderate obstructive sleep apnea by number of events. Unfortunately, you have struggled with CPAP. You have looked into trying an oral appliance. I would like to make a referral to Dr. Toy Cookey at this time for you to be considered for a custom-made oral appliance for treatment of obstructive sleep apnea. We will send my records, and your sleep study results to her office and you should hear from her office directly. I will see you back at this point on an as-needed basis. I would be happy to retry with you CPAP therapy in the future if you choose to go back on CPAP treatment. Please get in touch with your DME company, [employer], about returning your CPAP machine, vs. Purchasing the CPAP from them.

## 2017-05-14 DIAGNOSIS — D649 Anemia, unspecified: Secondary | ICD-10-CM | POA: Diagnosis not present

## 2017-05-14 DIAGNOSIS — Z79890 Hormone replacement therapy (postmenopausal): Secondary | ICD-10-CM | POA: Diagnosis not present

## 2017-05-25 DIAGNOSIS — G4733 Obstructive sleep apnea (adult) (pediatric): Secondary | ICD-10-CM | POA: Diagnosis not present

## 2017-06-16 DIAGNOSIS — G4733 Obstructive sleep apnea (adult) (pediatric): Secondary | ICD-10-CM | POA: Diagnosis not present

## 2018-02-03 DIAGNOSIS — L280 Lichen simplex chronicus: Secondary | ICD-10-CM | POA: Diagnosis not present

## 2018-02-03 DIAGNOSIS — L821 Other seborrheic keratosis: Secondary | ICD-10-CM | POA: Diagnosis not present

## 2018-02-03 DIAGNOSIS — B0229 Other postherpetic nervous system involvement: Secondary | ICD-10-CM | POA: Diagnosis not present

## 2018-02-03 DIAGNOSIS — L570 Actinic keratosis: Secondary | ICD-10-CM | POA: Diagnosis not present

## 2018-02-03 DIAGNOSIS — D485 Neoplasm of uncertain behavior of skin: Secondary | ICD-10-CM | POA: Diagnosis not present

## 2018-09-14 DIAGNOSIS — H18822 Corneal disorder due to contact lens, left eye: Secondary | ICD-10-CM | POA: Diagnosis not present

## 2018-10-19 DIAGNOSIS — E039 Hypothyroidism, unspecified: Secondary | ICD-10-CM | POA: Diagnosis not present

## 2018-10-19 DIAGNOSIS — Z79890 Hormone replacement therapy (postmenopausal): Secondary | ICD-10-CM | POA: Diagnosis not present

## 2018-10-19 DIAGNOSIS — E559 Vitamin D deficiency, unspecified: Secondary | ICD-10-CM | POA: Diagnosis not present

## 2018-10-19 DIAGNOSIS — R5383 Other fatigue: Secondary | ICD-10-CM | POA: Diagnosis not present

## 2018-10-19 DIAGNOSIS — E782 Mixed hyperlipidemia: Secondary | ICD-10-CM | POA: Diagnosis not present

## 2018-10-26 DIAGNOSIS — Z79890 Hormone replacement therapy (postmenopausal): Secondary | ICD-10-CM | POA: Diagnosis not present

## 2019-01-22 DIAGNOSIS — K130 Diseases of lips: Secondary | ICD-10-CM | POA: Diagnosis not present

## 2019-01-27 ENCOUNTER — Ambulatory Visit (INDEPENDENT_AMBULATORY_CARE_PROVIDER_SITE_OTHER): Payer: PPO | Admitting: Licensed Clinical Social Worker

## 2019-01-27 DIAGNOSIS — F4323 Adjustment disorder with mixed anxiety and depressed mood: Secondary | ICD-10-CM

## 2019-02-09 ENCOUNTER — Ambulatory Visit: Payer: PPO | Admitting: Licensed Clinical Social Worker

## 2019-02-09 ENCOUNTER — Ambulatory Visit (INDEPENDENT_AMBULATORY_CARE_PROVIDER_SITE_OTHER): Payer: PPO | Admitting: Licensed Clinical Social Worker

## 2019-02-09 DIAGNOSIS — F4323 Adjustment disorder with mixed anxiety and depressed mood: Secondary | ICD-10-CM | POA: Diagnosis not present

## 2019-02-10 DIAGNOSIS — L570 Actinic keratosis: Secondary | ICD-10-CM | POA: Diagnosis not present

## 2019-02-10 DIAGNOSIS — B078 Other viral warts: Secondary | ICD-10-CM | POA: Diagnosis not present

## 2019-02-10 DIAGNOSIS — L814 Other melanin hyperpigmentation: Secondary | ICD-10-CM | POA: Diagnosis not present

## 2019-02-17 ENCOUNTER — Ambulatory Visit (INDEPENDENT_AMBULATORY_CARE_PROVIDER_SITE_OTHER): Payer: PPO | Admitting: Licensed Clinical Social Worker

## 2019-02-17 DIAGNOSIS — F4323 Adjustment disorder with mixed anxiety and depressed mood: Secondary | ICD-10-CM

## 2019-02-23 ENCOUNTER — Ambulatory Visit (INDEPENDENT_AMBULATORY_CARE_PROVIDER_SITE_OTHER): Payer: PPO | Admitting: Licensed Clinical Social Worker

## 2019-02-23 DIAGNOSIS — F4323 Adjustment disorder with mixed anxiety and depressed mood: Secondary | ICD-10-CM

## 2019-03-09 ENCOUNTER — Ambulatory Visit: Payer: Self-pay | Admitting: Licensed Clinical Social Worker

## 2019-05-11 DIAGNOSIS — L237 Allergic contact dermatitis due to plants, except food: Secondary | ICD-10-CM | POA: Diagnosis not present

## 2019-08-09 DIAGNOSIS — H4051X2 Glaucoma secondary to other eye disorders, right eye, moderate stage: Secondary | ICD-10-CM | POA: Diagnosis not present

## 2019-11-12 DIAGNOSIS — F4322 Adjustment disorder with anxiety: Secondary | ICD-10-CM | POA: Diagnosis not present

## 2019-11-16 ENCOUNTER — Other Ambulatory Visit: Payer: Self-pay | Admitting: Family Medicine

## 2019-11-16 ENCOUNTER — Ambulatory Visit
Admission: RE | Admit: 2019-11-16 | Discharge: 2019-11-16 | Disposition: A | Discharge status: Home / Self Care | Payer: PPO | Source: Ambulatory Visit | Attending: Family Medicine | Admitting: Family Medicine

## 2019-11-16 ENCOUNTER — Other Ambulatory Visit: Payer: Self-pay

## 2019-11-16 DIAGNOSIS — Z1231 Encounter for screening mammogram for malignant neoplasm of breast: Secondary | ICD-10-CM | POA: Diagnosis not present

## 2019-11-18 DIAGNOSIS — H4051X2 Glaucoma secondary to other eye disorders, right eye, moderate stage: Secondary | ICD-10-CM | POA: Diagnosis not present

## 2019-11-19 DIAGNOSIS — F4322 Adjustment disorder with anxiety: Secondary | ICD-10-CM | POA: Diagnosis not present

## 2019-12-13 DIAGNOSIS — H4051X2 Glaucoma secondary to other eye disorders, right eye, moderate stage: Secondary | ICD-10-CM | POA: Diagnosis not present

## 2019-12-30 DIAGNOSIS — H40002 Preglaucoma, unspecified, left eye: Secondary | ICD-10-CM | POA: Diagnosis not present

## 2019-12-30 DIAGNOSIS — H43391 Other vitreous opacities, right eye: Secondary | ICD-10-CM | POA: Diagnosis not present

## 2019-12-30 DIAGNOSIS — Z961 Presence of intraocular lens: Secondary | ICD-10-CM | POA: Diagnosis not present

## 2019-12-30 DIAGNOSIS — H4051X2 Glaucoma secondary to other eye disorders, right eye, moderate stage: Secondary | ICD-10-CM | POA: Diagnosis not present

## 2019-12-30 DIAGNOSIS — H25812 Combined forms of age-related cataract, left eye: Secondary | ICD-10-CM | POA: Diagnosis not present

## 2019-12-31 DIAGNOSIS — F4322 Adjustment disorder with anxiety: Secondary | ICD-10-CM | POA: Diagnosis not present

## 2020-01-07 DIAGNOSIS — F4322 Adjustment disorder with anxiety: Secondary | ICD-10-CM | POA: Diagnosis not present

## 2020-01-28 DIAGNOSIS — B369 Superficial mycosis, unspecified: Secondary | ICD-10-CM | POA: Diagnosis not present

## 2020-01-28 DIAGNOSIS — K130 Diseases of lips: Secondary | ICD-10-CM | POA: Diagnosis not present

## 2020-02-04 DIAGNOSIS — F4322 Adjustment disorder with anxiety: Secondary | ICD-10-CM | POA: Diagnosis not present

## 2020-02-17 DIAGNOSIS — L237 Allergic contact dermatitis due to plants, except food: Secondary | ICD-10-CM | POA: Diagnosis not present

## 2020-02-29 DIAGNOSIS — F4322 Adjustment disorder with anxiety: Secondary | ICD-10-CM | POA: Diagnosis not present

## 2020-03-27 DIAGNOSIS — F4322 Adjustment disorder with anxiety: Secondary | ICD-10-CM | POA: Diagnosis not present

## 2020-03-31 DIAGNOSIS — L299 Pruritus, unspecified: Secondary | ICD-10-CM | POA: Diagnosis not present

## 2020-03-31 DIAGNOSIS — H1012 Acute atopic conjunctivitis, left eye: Secondary | ICD-10-CM | POA: Diagnosis not present

## 2020-04-10 DIAGNOSIS — F4322 Adjustment disorder with anxiety: Secondary | ICD-10-CM | POA: Diagnosis not present

## 2020-05-26 DIAGNOSIS — Z23 Encounter for immunization: Secondary | ICD-10-CM | POA: Diagnosis not present

## 2020-05-26 DIAGNOSIS — W11XXXA Fall on and from ladder, initial encounter: Secondary | ICD-10-CM | POA: Diagnosis not present

## 2020-05-26 DIAGNOSIS — S0101XA Laceration without foreign body of scalp, initial encounter: Secondary | ICD-10-CM | POA: Diagnosis not present

## 2020-05-26 DIAGNOSIS — Z760 Encounter for issue of repeat prescription: Secondary | ICD-10-CM | POA: Diagnosis not present

## 2020-06-19 DIAGNOSIS — F4322 Adjustment disorder with anxiety: Secondary | ICD-10-CM | POA: Diagnosis not present

## 2020-07-03 DIAGNOSIS — F4322 Adjustment disorder with anxiety: Secondary | ICD-10-CM | POA: Diagnosis not present

## 2020-07-04 DIAGNOSIS — Z20828 Contact with and (suspected) exposure to other viral communicable diseases: Secondary | ICD-10-CM | POA: Diagnosis not present

## 2020-07-28 DIAGNOSIS — G4489 Other headache syndrome: Secondary | ICD-10-CM | POA: Diagnosis not present

## 2020-07-28 DIAGNOSIS — R197 Diarrhea, unspecified: Secondary | ICD-10-CM | POA: Diagnosis not present

## 2020-07-28 DIAGNOSIS — U071 COVID-19: Secondary | ICD-10-CM | POA: Diagnosis not present

## 2020-07-28 DIAGNOSIS — R509 Fever, unspecified: Secondary | ICD-10-CM | POA: Diagnosis not present

## 2020-07-28 DIAGNOSIS — R110 Nausea: Secondary | ICD-10-CM | POA: Diagnosis not present

## 2020-08-03 DIAGNOSIS — R918 Other nonspecific abnormal finding of lung field: Secondary | ICD-10-CM | POA: Diagnosis not present

## 2020-08-03 DIAGNOSIS — Z79899 Other long term (current) drug therapy: Secondary | ICD-10-CM | POA: Diagnosis not present

## 2020-08-03 DIAGNOSIS — J984 Other disorders of lung: Secondary | ICD-10-CM | POA: Diagnosis not present

## 2020-08-03 DIAGNOSIS — R2240 Localized swelling, mass and lump, unspecified lower limb: Secondary | ICD-10-CM | POA: Diagnosis not present

## 2020-08-03 DIAGNOSIS — Z833 Family history of diabetes mellitus: Secondary | ICD-10-CM | POA: Diagnosis not present

## 2020-08-03 DIAGNOSIS — Z8249 Family history of ischemic heart disease and other diseases of the circulatory system: Secondary | ICD-10-CM | POA: Diagnosis not present

## 2020-08-03 DIAGNOSIS — J1282 Pneumonia due to coronavirus disease 2019: Secondary | ICD-10-CM | POA: Diagnosis not present

## 2020-08-03 DIAGNOSIS — J9811 Atelectasis: Secondary | ICD-10-CM | POA: Diagnosis not present

## 2020-08-03 DIAGNOSIS — E872 Acidosis: Secondary | ICD-10-CM | POA: Diagnosis not present

## 2020-08-03 DIAGNOSIS — E222 Syndrome of inappropriate secretion of antidiuretic hormone: Secondary | ICD-10-CM | POA: Diagnosis not present

## 2020-08-03 DIAGNOSIS — E871 Hypo-osmolality and hyponatremia: Secondary | ICD-10-CM | POA: Diagnosis not present

## 2020-08-03 DIAGNOSIS — D75839 Thrombocytosis, unspecified: Secondary | ICD-10-CM | POA: Diagnosis not present

## 2020-08-03 DIAGNOSIS — R778 Other specified abnormalities of plasma proteins: Secondary | ICD-10-CM | POA: Diagnosis not present

## 2020-08-03 DIAGNOSIS — J9601 Acute respiratory failure with hypoxia: Secondary | ICD-10-CM | POA: Diagnosis not present

## 2020-08-03 DIAGNOSIS — U071 COVID-19: Secondary | ICD-10-CM | POA: Diagnosis not present

## 2020-08-23 DIAGNOSIS — Z20828 Contact with and (suspected) exposure to other viral communicable diseases: Secondary | ICD-10-CM | POA: Diagnosis not present

## 2020-09-21 ENCOUNTER — Institutional Professional Consult (permissible substitution): Payer: PPO | Admitting: Pulmonary Disease

## 2020-10-02 ENCOUNTER — Other Ambulatory Visit (INDEPENDENT_AMBULATORY_CARE_PROVIDER_SITE_OTHER): Payer: PPO

## 2020-10-02 ENCOUNTER — Ambulatory Visit: Payer: PPO | Admitting: Pulmonary Disease

## 2020-10-02 ENCOUNTER — Other Ambulatory Visit: Payer: Self-pay

## 2020-10-02 ENCOUNTER — Encounter: Payer: Self-pay | Admitting: Pulmonary Disease

## 2020-10-02 VITALS — BP 124/72 | HR 80 | Temp 97.3°F | Ht 61.5 in | Wt 127.8 lb

## 2020-10-02 DIAGNOSIS — E871 Hypo-osmolality and hyponatremia: Secondary | ICD-10-CM

## 2020-10-02 DIAGNOSIS — U071 COVID-19: Secondary | ICD-10-CM

## 2020-10-02 DIAGNOSIS — J9601 Acute respiratory failure with hypoxia: Secondary | ICD-10-CM | POA: Diagnosis not present

## 2020-10-02 DIAGNOSIS — J1282 Pneumonia due to coronavirus disease 2019: Secondary | ICD-10-CM | POA: Diagnosis not present

## 2020-10-02 LAB — COMPREHENSIVE METABOLIC PANEL
ALT: 16 U/L (ref 0–35)
AST: 17 U/L (ref 0–37)
Albumin: 4.4 g/dL (ref 3.5–5.2)
Alkaline Phosphatase: 37 U/L — ABNORMAL LOW (ref 39–117)
BUN: 22 mg/dL (ref 6–23)
CO2: 28 mEq/L (ref 19–32)
Calcium: 10.3 mg/dL (ref 8.4–10.5)
Chloride: 102 mEq/L (ref 96–112)
Creatinine, Ser: 0.63 mg/dL (ref 0.40–1.20)
GFR: 90.71 mL/min (ref >60.00)
Glucose, Bld: 93 mg/dL (ref 70–99)
Potassium: 4.3 mEq/L (ref 3.5–5.1)
Sodium: 139 mEq/L (ref 135–145)
Total Bilirubin: 0.4 mg/dL (ref 0.2–1.2)
Total Protein: 7.1 g/dL (ref 6.0–8.3)

## 2020-10-02 NOTE — Patient Instructions (Addendum)
Recommend sleeping on your side due to the sleep apnea and also recommend using the mouth piece.  We will check a chest radiograph at your follow up visit  We will check lab work today

## 2020-10-02 NOTE — Progress Notes (Signed)
Synopsis: Self Referred in January 2022 for post-covid pneumonia  Subjective:   PATIENT ID: Wendy Elliott GENDER: female DOB: 1951-05-11, MRN: [id number]   HPI  Chief Complaint  Patient presents with  . Consult    Post Covid pneumonia    Wendy Elliott is a 70 year old woman, never smoker with history of sleep apnea who comes to pulmonary clinic for evaluation after being admitted with respiratory failure due to covid 19 pneumonia in 07/2020 at [facility].   She was admitted to ICU at [facility] 07/2020 requiring HFNC treated with dexamethasone, baricitinib and patient refused remdesivir. She was there for 8 days. She was not discharged on oxygen as her ambulatory oxygen saturation prior to discharge was 91%. She reports she has made a good recovery and is without complaints at this time. She has not started to exercise again as did not know if it would be safe or not. She denies fatigue, cough or wheezing.   She was diagnosed with moderate sleep apnea in 2018 and had an oral device made which she does not use. She tries to sleep on her side.   Past Medical History:  Diagnosis Date  . Hormone replacement therapy   . Hypothyroidism   . Insomnia   . Sleep apnea      Family History  Problem Relation Age of Onset  . Osteoporosis Mother      Social History   Socioeconomic History  . Marital status: Single    Spouse name: Not on file  . Number of children: 0  . Years of education: 52  . Highest education level: Not on file  Occupational History  . Occupation: Self Employeed   Tobacco Use  . Smoking status: Former Research scientist (life sciences)  . Smokeless tobacco: Never Used  . Tobacco comment: Quit about 2000  Substance and Sexual Activity  . Alcohol use: Yes  . Drug use: No  . Sexual activity: Not on file  Other Topics Concern  . Not on file  Social History Narrative   Rarely drinks caffeine    Social Determinants of Health   Financial Resource Strain: Not on file  Food Insecurity: Not on file   Transportation Needs: Not on file  Physical Activity: Not on file  Stress: Not on file  Social Connections: Not on file  Intimate Partner Violence: Not on file     No Known Allergies   Outpatient Medications Prior to Visit  Medication Sig Dispense Refill  . Estradiol (ESTRACE VA) Place vaginally.    Marland Kitchen estradiol (ESTRACE) 0.1 MG/GM vaginal cream Place 1 Applicatorful vaginally at bedtime.    . fluorometholone (FML) 0.1 % ophthalmic suspension 1 drop every 4 (four) hours.    Marland Kitchen latanoprost (XALATAN) 0.005 % ophthalmic solution Administer 1 drop into both eyes at bedtime.    . ondansetron (ZOFRAN) 4 MG tablet Take by mouth.    . Probiotic Product (PROBIOTIC PO) Take by mouth.    . Progesterone 40 % CREA by Does not apply route.    . Testosterone 20 % CREA by Does not apply route.    . Travoprost, BAK Free, (TRAVATAN) 0.004 % SOLN ophthalmic solution Travatan Z 0.004 % eye drops  PLACE 1 DROP INTO RIGHT EYE QHS    . valACYclovir (VALTREX) 1000 MG tablet Take by mouth.     No facility-administered medications prior to visit.    Review of Systems  Constitutional: Negative for chills, fever, malaise/fatigue and weight loss.  HENT: Negative for congestion, sinus pain  and sore throat.   Eyes: Negative.   Respiratory: Negative for cough, hemoptysis, sputum production, shortness of breath and wheezing.   Cardiovascular: Negative for chest pain, palpitations, orthopnea, claudication and leg swelling.  Gastrointestinal: Negative for abdominal pain, heartburn, nausea and vomiting.  Genitourinary: Negative.   Musculoskeletal: Negative for joint pain and myalgias.  Skin: Negative for rash.  Neurological: Negative for weakness.  Endo/Heme/Allergies: Negative.   Psychiatric/Behavioral: Negative.       Objective:   Vitals:   10/02/20 1135  BP: 124/72  Pulse: 80  Temp: (!) 97.3 F (36.3 C)  TempSrc: Oral  SpO2: 98%  Weight: 127 lb 12.8 oz (58 kg)  Height: 5' 1.5" (1.562 m)      Physical Exam Constitutional:      General: She is not in acute distress.    Appearance: Normal appearance. She is normal weight. She is not ill-appearing.  HENT:     Head: Normocephalic and atraumatic.  Eyes:     General: No scleral icterus.    Conjunctiva/sclera: Conjunctivae normal.     Pupils: Pupils are equal, round, and reactive to light.  Cardiovascular:     Rate and Rhythm: Normal rate and regular rhythm.     Pulses: Normal pulses.     Heart sounds: Normal heart sounds. No murmur heard.   Pulmonary:     Effort: Pulmonary effort is normal.     Breath sounds: Rales (dry, bilateral bases) present. No wheezing or rhonchi.  Abdominal:     General: Bowel sounds are normal.     Palpations: Abdomen is soft.  Musculoskeletal:     Right lower leg: No edema.     Left lower leg: No edema.  Lymphadenopathy:     Cervical: No cervical adenopathy.  Skin:    General: Skin is warm and dry.  Neurological:     General: No focal deficit present.     Mental Status: She is alert.  Psychiatric:        Mood and Affect: Mood normal.        Behavior: Behavior normal.        Thought Content: Thought content normal.        Judgment: Judgment normal.    CBC No results found for: WBC, RBC, HGB, HCT, PLT, MCV, MCH, MCHC, RDW, LYMPHSABS, MONOABS, EOSABS, BASOSABS  Chest imaging: CT Chest 08/03/20 - report reviewed from [facility] Lungs: Bilateral ground-glass opacities with peripheral distribution throughout  the lungs consistent with active inflammation and infection, probably viral.  Pleural spaces: Unremarkable. No pneumothorax. No pleural effusion.  Heart: Unremarkable. No cardiomegaly. No pericardial effusion.  Pulmonary arteries: No pulmonary embolus. No dissection or aneurysm in the  chest.  Aorta: Unremarkable. No aortic aneurysm.  Lymph nodes: Borderline right hilar nonspecific lymph nodes.  Diaphragm: Minimal hiatal hernia.  Liver: Fatty liver.  Bones/joints: Unremarkable. No  acute fracture.  Soft tissues: Unremarkable.  PFT: No flowsheet data found.  Sleep Study 01/2017 AHI 15.3 CPAP titration study: 7cmH2O  LE Doppler 08/03/20 Conclusion:  No evidence of right or left lower extremity deep venous thrombosis.   Assessment & Plan:   Pneumonia due to COVID-19 virus - Plan: DG Chest 2 View, CANCELED: DG Chest 2 View  Acute respiratory failure with hypoxia (HCC)  Hyponatremia - Plan: Comp Met (CMET)  Discussion: Wendy Elliott is a 70 year old woman, never smoker with history of sleep apnea who comes to pulmonary clinic for evaluation after being admitted with respiratory failure due to covid 19  pneumonia in 07/2020 at [facility].   She has done well since her hospitalization. Her ambulatory oxygen saturations are 92% after 3 laps and 96% at rest. She is safe to resume physical activity.   She is to follow up in 4 months with chest radiograph to monitor resolution of her previous pneumonia.   We will check a CMP today to follow up lab abnormalities from her hospitalization. She does not have a primary care.   Follow up in 4 months.   >60 minutes was spent on reviewing chart records, direct patient care and completing documentation.  Freda Jackson, MD [facility] Office: [phone]   See Amion for Pager Details      Current Outpatient Medications:  .  Estradiol (ESTRACE VA), Place vaginally., Disp: , Rfl:  .  estradiol (ESTRACE) 0.1 MG/GM vaginal cream, Place 1 Applicatorful vaginally at bedtime., Disp: , Rfl:  .  fluorometholone (FML) 0.1 % ophthalmic suspension, 1 drop every 4 (four) hours., Disp: , Rfl:  .  latanoprost (XALATAN) 0.005 % ophthalmic solution, Administer 1 drop into both eyes at bedtime., Disp: , Rfl:  .  ondansetron (ZOFRAN) 4 MG tablet, Take by mouth., Disp: , Rfl:  .  Probiotic Product (PROBIOTIC PO), Take by mouth., Disp: , Rfl:  .  Progesterone 40 % CREA, by Does not apply route., Disp: , Rfl:  .   Testosterone 20 % CREA, by Does not apply route., Disp: , Rfl:  .  Travoprost, BAK Free, (TRAVATAN) 0.004 % SOLN ophthalmic solution, Travatan Z 0.004 % eye drops  PLACE 1 DROP INTO RIGHT EYE QHS, Disp: , Rfl:  .  valACYclovir (VALTREX) 1000 MG tablet, Take by mouth., Disp: , Rfl:

## 2020-10-26 DIAGNOSIS — M9901 Segmental and somatic dysfunction of cervical region: Secondary | ICD-10-CM | POA: Diagnosis not present

## 2020-10-26 DIAGNOSIS — M9905 Segmental and somatic dysfunction of pelvic region: Secondary | ICD-10-CM | POA: Diagnosis not present

## 2020-10-26 DIAGNOSIS — M9902 Segmental and somatic dysfunction of thoracic region: Secondary | ICD-10-CM | POA: Diagnosis not present

## 2020-10-26 DIAGNOSIS — M9903 Segmental and somatic dysfunction of lumbar region: Secondary | ICD-10-CM | POA: Diagnosis not present

## 2020-10-26 DIAGNOSIS — M25512 Pain in left shoulder: Secondary | ICD-10-CM | POA: Diagnosis not present

## 2020-10-26 DIAGNOSIS — M25511 Pain in right shoulder: Secondary | ICD-10-CM | POA: Diagnosis not present

## 2020-11-06 DIAGNOSIS — Z01812 Encounter for preprocedural laboratory examination: Secondary | ICD-10-CM | POA: Diagnosis not present

## 2020-11-09 DIAGNOSIS — D1339 Benign neoplasm of other parts of small intestine: Secondary | ICD-10-CM | POA: Diagnosis not present

## 2020-11-09 DIAGNOSIS — Z86010 Personal history of colonic polyps: Secondary | ICD-10-CM | POA: Diagnosis not present

## 2020-11-09 DIAGNOSIS — K5730 Diverticulosis of large intestine without perforation or abscess without bleeding: Secondary | ICD-10-CM | POA: Diagnosis not present

## 2020-11-14 DIAGNOSIS — D1339 Benign neoplasm of other parts of small intestine: Secondary | ICD-10-CM | POA: Diagnosis not present

## 2020-12-14 DIAGNOSIS — F419 Anxiety disorder, unspecified: Secondary | ICD-10-CM | POA: Diagnosis not present

## 2020-12-14 DIAGNOSIS — K130 Diseases of lips: Secondary | ICD-10-CM | POA: Diagnosis not present

## 2021-01-22 ENCOUNTER — Ambulatory Visit: Payer: PPO | Admitting: Family Medicine

## 2021-01-25 DIAGNOSIS — F418 Other specified anxiety disorders: Secondary | ICD-10-CM | POA: Diagnosis not present

## 2021-01-25 DIAGNOSIS — U099 Post covid-19 condition, unspecified: Secondary | ICD-10-CM | POA: Diagnosis not present

## 2021-01-25 DIAGNOSIS — R432 Parageusia: Secondary | ICD-10-CM | POA: Diagnosis not present

## 2021-02-27 DIAGNOSIS — F332 Major depressive disorder, recurrent severe without psychotic features: Secondary | ICD-10-CM | POA: Diagnosis not present

## 2021-03-13 DIAGNOSIS — F332 Major depressive disorder, recurrent severe without psychotic features: Secondary | ICD-10-CM | POA: Diagnosis not present

## 2021-03-15 ENCOUNTER — Other Ambulatory Visit: Payer: Self-pay | Admitting: Family Medicine

## 2021-03-16 DIAGNOSIS — F4321 Adjustment disorder with depressed mood: Secondary | ICD-10-CM | POA: Diagnosis not present

## 2021-03-20 ENCOUNTER — Ambulatory Visit: Payer: PPO | Admitting: Pulmonary Disease

## 2021-03-21 ENCOUNTER — Other Ambulatory Visit: Payer: Self-pay

## 2021-03-22 ENCOUNTER — Ambulatory Visit (INDEPENDENT_AMBULATORY_CARE_PROVIDER_SITE_OTHER): Payer: PPO | Admitting: Internal Medicine

## 2021-03-22 ENCOUNTER — Encounter: Payer: Self-pay | Admitting: Internal Medicine

## 2021-03-22 DIAGNOSIS — F339 Major depressive disorder, recurrent, unspecified: Secondary | ICD-10-CM | POA: Diagnosis not present

## 2021-03-22 MED ORDER — BUPROPION HCL ER (XL) 150 MG PO TB24: 150.0000 mg | ORAL_TABLET | Freq: Every day | ORAL | 1 refills | Status: DC

## 2021-03-22 NOTE — Progress Notes (Signed)
New Patient Office Visit     This visit occurred during the SARS-CoV-2 public health emergency.  Safety protocols were in place, including screening questions prior to the visit, additional usage of staff PPE, and extensive cleaning of exam room while observing appropriate contact time as indicated for disinfecting solutions.    CC/Reason for Visit: Establish care, discuss chronic conditions and some acute concerns Previous PCP: In [place] Last Visit: Few years ago  HPI: Wendy Elliott is a 70 y.o. female who is coming in today for the above mentioned reasons.  Per report she has no past medical history of significance.  She believes she might be depressed.  She tried Zoloft which per report did not work, however upon further questioning she only took it for 1 day and discontinued due to nausea.  She has been attending CBT sessions once to twice a month.  She is wondering about prescription for benzodiazepines, a friend had also tried Wellbutrin with good results.  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  In December she was hospitalized in [place] for about a week for COVID-pneumonia.  She has yet to be vaccinated against COVID or shingles.  She is also overdue for pneumonia vaccination.  Her Tdap is up-to-date.  She was a smoker but quit over 20 years ago.  She drinks about 2 glasses of wine a week.  She has no known drug allergies, no past surgical history.  Her father had diabetes, her mother died at age 4 from unknown causes, her sister had dementia her paternal grandfather had colon cancer.  She had a colonoscopy in March and was told to follow-up in 10 years.  She is overdue for mammogram.  She is also due for cervical cancer screening.  Past Medical/Surgical History: Past Medical History:  Diagnosis Date   Hormone replacement therapy    Hypothyroidism    Insomnia     Sleep apnea     History reviewed. No pertinent surgical history.  Social History:  reports that she has quit smoking. She has never used smokeless tobacco. She reports current alcohol use of about 2.0 standard drinks of alcohol per week. She reports that she does not use drugs.  Allergies: No Known Allergies  Family History:  Family History  Problem Relation Age of Onset   Osteoporosis Mother    Diabetes Father    Dementia Sister      Current Outpatient Medications:    buPROPion (WELLBUTRIN XL) 150 MG 24 hr tablet, Take 1 tablet (150 mg total) by mouth daily., Disp: 90 tablet, Rfl: 1   Estradiol (ESTRACE VA), Place vaginally., Disp: , Rfl:    latanoprost (XALATAN) 0.005 % ophthalmic solution, Administer 1 drop into both eyes at bedtime., Disp: , Rfl:    ondansetron (ZOFRAN) 4 MG tablet, Take by mouth., Disp: , Rfl:    Probiotic Product (PROBIOTIC PO), Take by mouth., Disp: , Rfl:    Progesterone 40 % CREA, by Does not apply route., Disp: , Rfl:    Testosterone 20 % CREA, by Does not apply route., Disp: , Rfl:    Travoprost, BAK Free, (TRAVATAN) 0.004 % SOLN ophthalmic solution, Travatan Z 0.004 % eye drops  PLACE 1 DROP INTO RIGHT EYE QHS, Disp: , Rfl:    valACYclovir (VALTREX) 1000 MG tablet, Take by mouth daily. As needed, Disp: , Rfl:   Review of Systems:  Constitutional: Denies fever, chills, diaphoresis, appetite change and fatigue.  HEENT: Denies photophobia, eye pain, redness, hearing loss, ear pain, congestion, sore throat, rhinorrhea, sneezing, mouth sores, trouble swallowing, neck pain, neck stiffness and tinnitus.   Respiratory: Denies SOB, DOE, cough, chest tightness,  and wheezing.   Cardiovascular: Denies chest pain, palpitations and leg swelling.  Gastrointestinal: Denies nausea, vomiting, abdominal pain, diarrhea, constipation, blood in stool and abdominal distention.  Genitourinary: Denies dysuria, urgency, frequency, hematuria, flank pain and difficulty  urinating.  Endocrine: Denies: hot or cold intolerance, sweats, changes in hair or nails, polyuria, polydipsia. Musculoskeletal: Denies myalgias, back pain, joint swelling, arthralgias and gait problem.  Skin: Denies pallor, rash and wound.  Neurological: Denies dizziness, seizures, syncope, weakness, light-headedness, numbness and headaches.  Hematological: Denies adenopathy. Easy bruising, personal or family bleeding history  Psychiatric/Behavioral: Denies suicidal ideation, mood changes, confusion, nervousness, sleep disturbance and agitation    Physical Exam: Vitals:   03/22/21 0913  BP: 120/78  Pulse: 77  Temp: 97.8 F (36.6 C)  TempSrc: Oral  SpO2: 95%  Weight: 131 lb 14.4 oz (59.8 kg)  Height: '5\' 2"'$  (1.575 m)   Body mass index is 24.12 kg/m.  Constitutional: NAD, calm, comfortable Eyes: PERRL, lids and conjunctivae normal ENMT: Mucous membranes are moist.  Neck: normal, supple, no masses, no thyromegaly Respiratory: clear to auscultation bilaterally, no wheezing, no crackles. Normal respiratory effort. No accessory muscle use.  Cardiovascular: Regular rate and rhythm, no murmurs / rubs / gallops. No extremity edema.  Neurologic: Grossly intact and nonfocal Psychiatric: Normal judgment and insight. Alert and oriented x 3. Normal mood.    Impression and Plan:  Depression, recurrent ([place]) [place] Office Visit from 03/22/2021 in [place] at [place]  PHQ-9 Total Score 6      -We have discussed that benzodiazepines are not appropriate treatment for depression even if she has some anxiety symptoms with it. -We have agreed to try Wellbutrin 150 mg daily, I have advised continued CBT sessions.  She will return in 8 weeks for follow-up.  She is overdue for many vaccines and all cancer screenings.  Referrals will be placed for mammogram and GYN.  Her colonoscopy is up-to-date.  She declines all vaccines today.  She will schedule return visit for CPE with  lab work.  Time spent: 40 minutes reviewing chart, interviewing and examining patient and formulating plan of care.    Patient Instructions  -Nice seeing you today!!  -Start Wellbutrin 150 mg daily.  -Schedule follow up in 2-3 months for your physical. Please come in fasting that day.   Lelon Frohlich, MD [facility]

## 2021-03-22 NOTE — Patient Instructions (Signed)
-  Nice seeing you today!!  -Start Wellbutrin 150 mg daily.  -Schedule follow up in 2-3 months for your physical. Please come in fasting that day.

## 2021-03-30 DIAGNOSIS — F4321 Adjustment disorder with depressed mood: Secondary | ICD-10-CM | POA: Diagnosis not present

## 2021-04-03 DIAGNOSIS — F332 Major depressive disorder, recurrent severe without psychotic features: Secondary | ICD-10-CM | POA: Diagnosis not present

## 2021-05-02 DIAGNOSIS — L218 Other seborrheic dermatitis: Secondary | ICD-10-CM | POA: Diagnosis not present

## 2021-05-02 DIAGNOSIS — L244 Irritant contact dermatitis due to drugs in contact with skin: Secondary | ICD-10-CM | POA: Diagnosis not present

## 2021-05-07 ENCOUNTER — Other Ambulatory Visit: Payer: Self-pay

## 2021-05-07 ENCOUNTER — Ambulatory Visit: Payer: PPO | Admitting: Pulmonary Disease

## 2021-05-07 ENCOUNTER — Ambulatory Visit (INDEPENDENT_AMBULATORY_CARE_PROVIDER_SITE_OTHER): Payer: PPO

## 2021-05-07 DIAGNOSIS — U071 COVID-19: Secondary | ICD-10-CM | POA: Diagnosis not present

## 2021-05-07 DIAGNOSIS — R918 Other nonspecific abnormal finding of lung field: Secondary | ICD-10-CM | POA: Diagnosis not present

## 2021-05-07 DIAGNOSIS — J1282 Pneumonia due to coronavirus disease 2019: Secondary | ICD-10-CM | POA: Diagnosis not present

## 2021-05-07 DIAGNOSIS — J189 Pneumonia, unspecified organism: Secondary | ICD-10-CM | POA: Diagnosis not present

## 2021-05-07 NOTE — Progress Notes (Deleted)
Synopsis: Self Referred in January 2022 for post-covid pneumonia  Subjective:   PATIENT ID: Wendy Elliott GENDER: female DOB: 02/22/1951, MRN: [id number]   HPI  No chief complaint on file.  Wendy Elliott is a 70 year old woman, never smoker with history of sleep apnea who comes to pulmonary clinic for evaluation after being admitted with respiratory failure due to covid 19 pneumonia in 07/2020 at [facility].   She was admitted to ICU at [facility] 07/2020 requiring HFNC treated with dexamethasone, baricitinib and patient refused remdesivir. She was there for 8 days. She was not discharged on oxygen as her ambulatory oxygen saturation prior to discharge was 91%. She reports she has made a good recovery and is without complaints at this time. She has not started to exercise again as did not know if it would be safe or not. She denies fatigue, cough or wheezing.   She was diagnosed with moderate sleep apnea in 2018 and had an oral device made which she does not use. She tries to sleep on her side.   Past Medical History:  Diagnosis Date   Hormone replacement therapy    Hypothyroidism    Insomnia    Sleep apnea      Family History  Problem Relation Age of Onset   Osteoporosis Mother    Diabetes Father    Dementia Sister      Social History   Socioeconomic History   Marital status: Single    Spouse name: Not on file   Number of children: 0   Years of education: 13   Highest education level: Not on file  Occupational History   Occupation: Self Employeed   Tobacco Use   Smoking status: Former   Smokeless tobacco: Never   Tobacco comments:    Quit about 2000  Substance and Sexual Activity   Alcohol use: Yes    Alcohol/week: 2.0 standard drinks    Types: 2 Glasses of wine per week   Drug use: No   Sexual activity: Not on file  Other Topics Concern   Not on file  Social History Narrative   Rarely drinks caffeine    Social Determinants of Health   Financial Resource  Strain: Not on file  Food Insecurity: Not on file  Transportation Needs: Not on file  Physical Activity: Not on file  Stress: Not on file  Social Connections: Not on file  Intimate Partner Violence: Not on file     No Known Allergies   Outpatient Medications Prior to Visit  Medication Sig Dispense Refill   buPROPion (WELLBUTRIN XL) 150 MG 24 hr tablet Take 1 tablet (150 mg total) by mouth daily. 90 tablet 1   Estradiol (ESTRACE VA) Place vaginally.     latanoprost (XALATAN) 0.005 % ophthalmic solution Administer 1 drop into both eyes at bedtime.     ondansetron (ZOFRAN) 4 MG tablet Take by mouth.     Probiotic Product (PROBIOTIC PO) Take by mouth.     Progesterone 40 % CREA by Does not apply route.     Testosterone 20 % CREA by Does not apply route.     Travoprost, BAK Free, (TRAVATAN) 0.004 % SOLN ophthalmic solution Travatan Z 0.004 % eye drops  PLACE 1 DROP INTO RIGHT EYE QHS     valACYclovir (VALTREX) 1000 MG tablet Take by mouth daily. As needed     No facility-administered medications prior to visit.    Review of Systems  Constitutional:  Negative for chills, fever,  malaise/fatigue and weight loss.  HENT:  Negative for congestion, sinus pain and sore throat.   Eyes: Negative.   Respiratory:  Negative for cough, hemoptysis, sputum production, shortness of breath and wheezing.   Cardiovascular:  Negative for chest pain, palpitations, orthopnea, claudication and leg swelling.  Gastrointestinal:  Negative for abdominal pain, heartburn, nausea and vomiting.  Genitourinary: Negative.   Musculoskeletal:  Negative for joint pain and myalgias.  Skin:  Negative for rash.  Neurological:  Negative for weakness.  Endo/Heme/Allergies: Negative.   Psychiatric/Behavioral: Negative.       Objective:   There were no vitals filed for this visit.    Physical Exam Constitutional:      General: She is not in acute distress.    Appearance: Normal appearance. She is normal weight.  She is not ill-appearing.  HENT:     Head: Normocephalic and atraumatic.  Eyes:     General: No scleral icterus.    Conjunctiva/sclera: Conjunctivae normal.     Pupils: Pupils are equal, round, and reactive to light.  Cardiovascular:     Rate and Rhythm: Normal rate and regular rhythm.     Pulses: Normal pulses.     Heart sounds: Normal heart sounds. No murmur heard. Pulmonary:     Effort: Pulmonary effort is normal.     Breath sounds: Rales (dry, bilateral bases) present. No wheezing or rhonchi.  Abdominal:     General: Bowel sounds are normal.     Palpations: Abdomen is soft.  Musculoskeletal:     Right lower leg: No edema.     Left lower leg: No edema.  Lymphadenopathy:     Cervical: No cervical adenopathy.  Skin:    General: Skin is warm and dry.  Neurological:     General: No focal deficit present.     Mental Status: She is alert.  Psychiatric:        Mood and Affect: Mood normal.        Behavior: Behavior normal.        Thought Content: Thought content normal.        Judgment: Judgment normal.   CBC No results found for: WBC, RBC, HGB, HCT, PLT, MCV, MCH, MCHC, RDW, LYMPHSABS, MONOABS, EOSABS, BASOSABS  Chest imaging: CT Chest 08/03/20 - report reviewed from [facility] Lungs: Bilateral ground-glass opacities with peripheral distribution throughout  the lungs consistent with active inflammation and infection, probably viral.  Pleural spaces: Unremarkable. No pneumothorax. No pleural effusion.  Heart: Unremarkable. No cardiomegaly. No pericardial effusion.  Pulmonary arteries: No pulmonary embolus. No dissection or aneurysm in the  chest.  Aorta: Unremarkable. No aortic aneurysm.  Lymph nodes: Borderline right hilar nonspecific lymph nodes.  Diaphragm: Minimal hiatal hernia.  Liver: Fatty liver.  Bones/joints: Unremarkable. No acute fracture.  Soft tissues: Unremarkable.  PFT: No flowsheet data found.  Sleep Study 01/2017 AHI 15.3 CPAP titration study: 7cmH2O  LE  Doppler 08/03/20 Conclusion:  No evidence of right or left lower extremity deep venous thrombosis.   Assessment & Plan:   No diagnosis found.  Discussion: Wendy Elliott is a 70 year old woman, never smoker with history of sleep apnea who comes to pulmonary clinic for evaluation after being admitted with respiratory failure due to covid 19 pneumonia in 07/2020 at [facility].   She has done well since her hospitalization. Her ambulatory oxygen saturations are 92% after 3 laps and 96% at rest. She is safe to resume physical activity.   She is to follow up in 4  months with chest radiograph to monitor resolution of her previous pneumonia.   We will check a CMP today to follow up lab abnormalities from her hospitalization. She does not have a primary care.   Follow up in 4 months.   >60 minutes was spent on reviewing chart records, direct patient care and completing documentation.  Freda Jackson, MD [facility] Office: [phone]   See Amion for Pager Details      Current Outpatient Medications:    buPROPion (WELLBUTRIN XL) 150 MG 24 hr tablet, Take 1 tablet (150 mg total) by mouth daily., Disp: 90 tablet, Rfl: 1   Estradiol (ESTRACE VA), Place vaginally., Disp: , Rfl:    latanoprost (XALATAN) 0.005 % ophthalmic solution, Administer 1 drop into both eyes at bedtime., Disp: , Rfl:    ondansetron (ZOFRAN) 4 MG tablet, Take by mouth., Disp: , Rfl:    Probiotic Product (PROBIOTIC PO), Take by mouth., Disp: , Rfl:    Progesterone 40 % CREA, by Does not apply route., Disp: , Rfl:    Testosterone 20 % CREA, by Does not apply route., Disp: , Rfl:    Travoprost, BAK Free, (TRAVATAN) 0.004 % SOLN ophthalmic solution, Travatan Z 0.004 % eye drops  PLACE 1 DROP INTO RIGHT EYE QHS, Disp: , Rfl:    valACYclovir (VALTREX) 1000 MG tablet, Take by mouth daily. As needed, Disp: , Rfl:

## 2021-05-08 DIAGNOSIS — J1282 Pneumonia due to coronavirus disease 2019: Secondary | ICD-10-CM

## 2021-05-08 DIAGNOSIS — U071 COVID-19: Secondary | ICD-10-CM

## 2021-05-08 NOTE — Telephone Encounter (Signed)
Received the following message from patient:   "The report mentioned taking antibiotics.   Will you be prescribing?     Also, I am not quite sure what the report means."  Patient came into the office yesterday for a CXR. She did not want an OV.   JD, can you please advise on her results? Thanks!

## 2021-05-16 ENCOUNTER — Other Ambulatory Visit: Payer: Self-pay

## 2021-05-16 ENCOUNTER — Ambulatory Visit (INDEPENDENT_AMBULATORY_CARE_PROVIDER_SITE_OTHER)
Admission: RE | Admit: 2021-05-16 | Discharge: 2021-05-16 | Disposition: A | Discharge status: Home / Self Care | Payer: PPO | Source: Ambulatory Visit | Attending: Pulmonary Disease | Admitting: Pulmonary Disease

## 2021-05-16 DIAGNOSIS — J189 Pneumonia, unspecified organism: Secondary | ICD-10-CM | POA: Diagnosis not present

## 2021-05-16 DIAGNOSIS — J1282 Pneumonia due to coronavirus disease 2019: Secondary | ICD-10-CM | POA: Diagnosis not present

## 2021-05-16 DIAGNOSIS — U071 COVID-19: Secondary | ICD-10-CM

## 2021-05-22 ENCOUNTER — Encounter: Payer: Self-pay | Admitting: Internal Medicine

## 2021-05-22 DIAGNOSIS — B370 Candidal stomatitis: Secondary | ICD-10-CM | POA: Diagnosis not present

## 2021-05-22 DIAGNOSIS — N898 Other specified noninflammatory disorders of vagina: Secondary | ICD-10-CM | POA: Diagnosis not present

## 2021-05-22 DIAGNOSIS — L209 Atopic dermatitis, unspecified: Secondary | ICD-10-CM | POA: Diagnosis not present

## 2021-05-22 DIAGNOSIS — G4700 Insomnia, unspecified: Secondary | ICD-10-CM | POA: Diagnosis not present

## 2021-05-23 ENCOUNTER — Other Ambulatory Visit: Payer: Self-pay | Admitting: Internal Medicine

## 2021-05-23 DIAGNOSIS — F339 Major depressive disorder, recurrent, unspecified: Secondary | ICD-10-CM

## 2021-05-23 MED ORDER — ALPRAZOLAM 0.25 MG PO TABS: 0.2500 mg | ORAL_TABLET | Freq: Two times a day (BID) | ORAL | 0 refills | Status: DC | PRN

## 2021-05-23 MED ORDER — BUPROPION HCL ER (XL) 300 MG PO TB24: 300.0000 mg | ORAL_TABLET | Freq: Every day | ORAL | 1 refills | Status: DC

## 2021-06-05 ENCOUNTER — Encounter: Payer: Self-pay | Admitting: Pulmonary Disease

## 2021-06-05 ENCOUNTER — Other Ambulatory Visit: Payer: Self-pay

## 2021-06-05 ENCOUNTER — Ambulatory Visit: Payer: PPO | Admitting: Pulmonary Disease

## 2021-06-05 VITALS — BP 118/72 | HR 77 | Ht 62.0 in | Wt 136.4 lb

## 2021-06-05 DIAGNOSIS — J1282 Pneumonia due to coronavirus disease 2019: Secondary | ICD-10-CM | POA: Diagnosis not present

## 2021-06-05 DIAGNOSIS — U071 COVID-19: Secondary | ICD-10-CM

## 2021-06-05 NOTE — Patient Instructions (Addendum)
Your CT scan is showing signs of scarring from the covid 19 infection but it is reassuring that you are back to working out and being active without issues of shortness of breath.   If you develop worsening respiratory symptoms please give [place] a call.

## 2021-06-05 NOTE — Progress Notes (Signed)
Synopsis: Self Referred in January 2022 for post-covid pneumonia  Subjective:   PATIENT ID: Wendy Elliott GENDER: female DOB: 1951/06/22, MRN: [id number]  HPI  Chief Complaint  Patient presents with   Follow-up    F/U after CT on 05/16/21. States her breathing has been ok since last visit.    Wendy Elliott is a 70 year old woman, never smoker with history of sleep apnea returns to pulmonary clinic for follow up of respiratory failure due to covid 19 pneumonia in 07/2020 at [facility].   Patient reports her shortness of breath is significantly improved since last visit.  She is back to working out at [employer] and she has no issues performing her daily activities of living.  She denies cough, wheezing or sputum production.  She also reports that her anxiety/depression has significantly improved since last visit.  OV 10/02/20 She was admitted to ICU at [facility] 07/2020 requiring HFNC treated with dexamethasone, baricitinib and patient refused remdesivir. She was there for 8 days. She was not discharged on oxygen as her ambulatory oxygen saturation prior to discharge was 91%. She reports she has made a good recovery and is without complaints at this time. She has not started to exercise again as did not know if it would be safe or not. She denies fatigue, cough or wheezing.   She was diagnosed with moderate sleep apnea in 2018 and had an oral device made which she does not use. She tries to sleep on her side.   Past Medical History:  Diagnosis Date   Hormone replacement therapy    Hypothyroidism    Insomnia    Sleep apnea      Family History  Problem Relation Age of Onset   Osteoporosis Mother    Diabetes Father    Dementia Sister      Social History   Socioeconomic History   Marital status: Single    Spouse name: Not on file   Number of children: 0   Years of education: 13   Highest education level: Not on file  Occupational History   Occupation: Self Employeed   Tobacco Use    Smoking status: Former   Smokeless tobacco: Never   Tobacco comments:    Quit about 2000  Substance and Sexual Activity   Alcohol use: Yes    Alcohol/week: 2.0 standard drinks    Types: 2 Glasses of wine per week   Drug use: No   Sexual activity: Not on file  Other Topics Concern   Not on file  Social History Narrative   Rarely drinks caffeine    Social Determinants of Health   Financial Resource Strain: Not on file  Food Insecurity: Not on file  Transportation Needs: Not on file  Physical Activity: Not on file  Stress: Not on file  Social Connections: Not on file  Intimate Partner Violence: Not on file     No Known Allergies   Outpatient Medications Prior to Visit  Medication Sig Dispense Refill   ALPRAZolam (XANAX) 0.25 MG tablet Take 1 tablet (0.25 mg total) by mouth 2 (two) times daily as needed for anxiety. 30 tablet 0   Estradiol (ESTRACE VA) Place vaginally.     latanoprost (XALATAN) 0.005 % ophthalmic solution Administer 1 drop into both eyes at bedtime.     ondansetron (ZOFRAN) 4 MG tablet Take by mouth.     Probiotic Product (PROBIOTIC PO) Take by mouth.     Progesterone 40 % CREA by Does not  apply route.     Testosterone 20 % CREA by Does not apply route.     Travoprost, BAK Free, (TRAVATAN) 0.004 % SOLN ophthalmic solution Travatan Z 0.004 % eye drops  PLACE 1 DROP INTO RIGHT EYE QHS     triamcinolone cream (KENALOG) 0.1 % APPLY ON THE SKIN TWICE A DAY APPLY TO EARS AS NEEDED ITCHING     valACYclovir (VALTREX) 1000 MG tablet Take by mouth daily. As needed     buPROPion (WELLBUTRIN XL) 300 MG 24 hr tablet Take 1 tablet (300 mg total) by mouth daily. 90 tablet 1   No facility-administered medications prior to visit.    Review of Systems  Constitutional:  Negative for chills, fever, malaise/fatigue and weight loss.  HENT:  Negative for congestion, sinus pain and sore throat.   Eyes: Negative.   Respiratory:  Negative for cough, hemoptysis, sputum  production, shortness of breath and wheezing.   Cardiovascular:  Negative for chest pain, palpitations, orthopnea, claudication and leg swelling.  Gastrointestinal:  Negative for abdominal pain, heartburn, nausea and vomiting.  Genitourinary: Negative.   Musculoskeletal:  Negative for joint pain and myalgias.  Skin:  Negative for rash.  Neurological:  Negative for weakness.  Endo/Heme/Allergies: Negative.   Psychiatric/Behavioral: Negative.       Objective:   Vitals:   06/05/21 1518  BP: 118/72  Pulse: 77  SpO2: 99%  Weight: 136 lb 6.4 oz (61.9 kg)  Height: 5\' 2"  (1.575 m)   Physical Exam Constitutional:      General: She is not in acute distress.    Appearance: Normal appearance. She is normal weight. She is not ill-appearing.  HENT:     Head: Normocephalic and atraumatic.  Eyes:     General: No scleral icterus.    Conjunctiva/sclera: Conjunctivae normal.     Pupils: Pupils are equal, round, and reactive to light.  Cardiovascular:     Rate and Rhythm: Normal rate and regular rhythm.     Pulses: Normal pulses.     Heart sounds: Normal heart sounds. No murmur heard. Pulmonary:     Effort: Pulmonary effort is normal.     Breath sounds: No wheezing, rhonchi or rales (dry, bilateral bases).  Abdominal:     General: Bowel sounds are normal.     Palpations: Abdomen is soft.  Musculoskeletal:     Right lower leg: No edema.     Left lower leg: No edema.  Skin:    General: Skin is warm and dry.  Neurological:     General: No focal deficit present.     Mental Status: She is alert.   CBC No results found for: WBC, RBC, HGB, HCT, PLT, MCV, MCH, MCHC, RDW, LYMPHSABS, MONOABS, EOSABS, BASOSABS  Chest imaging: CT Chest 05/16/21 1. Diffuse bandlike densities throughout both lungs compatible with areas of scarring and mild fibrosis. Findings are compatible with sequelae of previous COVID-19 infection. No evidence to suggest acute or active inflammation. 2. Mild compression  deformity involving T8 is nonspecific as described. 3. Hepatic steatosis.  CT Chest 08/03/20 - report reviewed from [facility] Lungs: Bilateral ground-glass opacities with peripheral distribution throughout  the lungs consistent with active inflammation and infection, probably viral.  Pleural spaces: Unremarkable. No pneumothorax. No pleural effusion.  Heart: Unremarkable. No cardiomegaly. No pericardial effusion.  Pulmonary arteries: No pulmonary embolus. No dissection or aneurysm in the  chest.  Aorta: Unremarkable. No aortic aneurysm.  Lymph nodes: Borderline right hilar nonspecific lymph nodes.  Diaphragm: Minimal hiatal  hernia.  Liver: Fatty liver.  Bones/joints: Unremarkable. No acute fracture.  Soft tissues: Unremarkable.  PFT: No flowsheet data found.  Sleep Study 01/2017 [facility] 15.3 CPAP titration study: 7cmH2O  LE Doppler 08/03/20 Conclusion:  No evidence of right or left lower extremity deep venous thrombosis.   Assessment & Plan:   Pneumonia due to COVID-19 virus  Discussion: Carleta Woodrow is a 70 year old woman, never smoker with history of sleep apnea returns to pulmonary clinic for follow up of respiratory failure due to covid 19 pneumonia in 07/2020 at [facility].    She has done well since last visit.  It appears she has made a remarkable recovery and is back to her baseline functional status along with working out again.  She denies any ongoing symptoms of shortness of breath or fatigue.  She has residual mild fibrotic changes likely related to her COVID-19 pneumonia.  CT chest scan reviewed with the patient during today's visit.  She can follow-up in 1 year.  Freda Jackson, MD [facility] Office: [phone]    Current Outpatient Medications:    ALPRAZolam (XANAX) 0.25 MG tablet, Take 1 tablet (0.25 mg total) by mouth 2 (two) times daily as needed for anxiety., Disp: 30 tablet, Rfl: 0   Estradiol (ESTRACE VA), Place vaginally., Disp: , Rfl:     latanoprost (XALATAN) 0.005 % ophthalmic solution, Administer 1 drop into both eyes at bedtime., Disp: , Rfl:    ondansetron (ZOFRAN) 4 MG tablet, Take by mouth., Disp: , Rfl:    Probiotic Product (PROBIOTIC PO), Take by mouth., Disp: , Rfl:    Progesterone 40 % CREA, by Does not apply route., Disp: , Rfl:    Testosterone 20 % CREA, by Does not apply route., Disp: , Rfl:    Travoprost, BAK Free, (TRAVATAN) 0.004 % SOLN ophthalmic solution, Travatan Z 0.004 % eye drops  PLACE 1 DROP INTO RIGHT EYE QHS, Disp: , Rfl:    triamcinolone cream (KENALOG) 0.1 %, APPLY ON THE SKIN TWICE A DAY APPLY TO EARS AS NEEDED ITCHING, Disp: , Rfl:    valACYclovir (VALTREX) 1000 MG tablet, Take by mouth daily. As needed, Disp: , Rfl:

## 2021-06-08 DIAGNOSIS — M25512 Pain in left shoulder: Secondary | ICD-10-CM | POA: Diagnosis not present

## 2021-06-08 DIAGNOSIS — M5451 Vertebrogenic low back pain: Secondary | ICD-10-CM | POA: Diagnosis not present

## 2021-06-08 DIAGNOSIS — M62830 Muscle spasm of back: Secondary | ICD-10-CM | POA: Diagnosis not present

## 2021-06-08 DIAGNOSIS — M545 Low back pain, unspecified: Secondary | ICD-10-CM | POA: Diagnosis not present

## 2021-06-09 ENCOUNTER — Encounter: Payer: Self-pay | Admitting: Pulmonary Disease

## 2021-06-25 ENCOUNTER — Other Ambulatory Visit: Payer: Self-pay

## 2021-06-25 ENCOUNTER — Ambulatory Visit: Payer: PPO | Admitting: Sports Medicine

## 2021-06-25 VITALS — BP 120/74 | Ht 62.0 in | Wt 128.0 lb

## 2021-06-25 DIAGNOSIS — M9902 Segmental and somatic dysfunction of thoracic region: Secondary | ICD-10-CM

## 2021-06-25 DIAGNOSIS — M9903 Segmental and somatic dysfunction of lumbar region: Secondary | ICD-10-CM

## 2021-06-25 DIAGNOSIS — M9908 Segmental and somatic dysfunction of rib cage: Secondary | ICD-10-CM | POA: Diagnosis not present

## 2021-06-25 DIAGNOSIS — S20211A Contusion of right front wall of thorax, initial encounter: Secondary | ICD-10-CM | POA: Diagnosis not present

## 2021-06-25 MED ORDER — CYCLOBENZAPRINE HCL 5 MG PO TABS: 5.0000 mg | ORAL_TABLET | Freq: Every day | ORAL | 0 refills | Status: DC

## 2021-06-25 NOTE — Patient Instructions (Signed)
Flexeril 5mg  use nightly  Follow up in 2 weeks

## 2021-06-25 NOTE — Progress Notes (Signed)
Wendy Elliott Wendy Elliott Wendy Elliott Phone: [phone]   Assessment and Plan:     1. Contusion, chest wall, right, initial encounter 2. Somatic dysfunction of thoracic region 3. Somatic dysfunction of lumbar region 4. Somatic dysfunction of rib region -Acute uncomplicated, initial sports medicine visit - Multiple musculoskeletal complaints after a metal shelf fell on the patient.  Mildly improving over 3 weeks time with continued pain likely from contusion to right sided chest wall, somatic dysfunction in thoracic spine - Patient elected for OMT.  Tolerated well per note below - Continue NSAIDs/Tylenol as needed for pain control - Start Flexeril 5 mg nightly for muscle spasms and tightness -No red flag symptoms today, so no imaging obtained   Decision today to treat with OMT was based on Physical Exam   After verbal consent patient was treated with HVLA (high velocity low amplitude), ME (muscle energy), FPR (flex positional release), ST (soft tissue), PC/PD (Pelvic Compression/ Pelvic Decompression) techniques in rib, thoracic, lumbar areas. Patient tolerated the procedure well with improvement in symptoms.  Patient educated on potential side effects of soreness and recommended to rest, hydrate, and use Tylenol as needed for pain control.   Pertinent previous records reviewed include note from 06/08/2021   Follow Up: 2 weeks for reevaluation.  Would consider x-ray versus repeat OMT based on patient's symptoms   Subjective:    Chief Complaint: Back pain  HPI:   06/25/21 Back pain started 3 weeks ago entire back. Right side is worse. Shelf fell on her. Chest is also sore and in pain. At night sharp pain and soreness. Constantly a dull ache. Nothing is really helping with the pain. Was given meloxicam, doesn't help.  Denies shortness of breath, dyspnea on exertion, left arm pain, jaw pain, syncope.  Relevant  Historical Information: None pertinent  Additional pertinent review of systems negative.   Current Outpatient Medications:    cyclobenzaprine (FLEXERIL) 5 MG tablet, Take 1 tablet (5 mg total) by mouth at bedtime., Disp: 10 tablet, Rfl: 0   ALPRAZolam (XANAX) 0.25 MG tablet, Take 1 tablet (0.25 mg total) by mouth 2 (two) times daily as needed for anxiety., Disp: 30 tablet, Rfl: 0   Estradiol (ESTRACE VA), Place vaginally., Disp: , Rfl:    latanoprost (XALATAN) 0.005 % ophthalmic solution, Administer 1 drop into both eyes at bedtime., Disp: , Rfl:    ondansetron (ZOFRAN) 4 MG tablet, Take by mouth., Disp: , Rfl:    Probiotic Product (PROBIOTIC PO), Take by mouth., Disp: , Rfl:    Progesterone 40 % CREA, by Does not apply route., Disp: , Rfl:    Testosterone 20 % CREA, by Does not apply route., Disp: , Rfl:    Travoprost, BAK Free, (TRAVATAN) 0.004 % SOLN ophthalmic solution, Travatan Z 0.004 % eye drops  PLACE 1 DROP INTO RIGHT EYE QHS, Disp: , Rfl:    triamcinolone cream (KENALOG) 0.1 %, APPLY ON THE SKIN TWICE A DAY APPLY TO EARS AS NEEDED ITCHING, Disp: , Rfl:    valACYclovir (VALTREX) 1000 MG tablet, Take by mouth daily. As needed, Disp: , Rfl:    Objective:     Vitals:   06/25/21 1057  BP: 120/74  Weight: 128 lb (58.1 kg)  Height: 5\' 2"  (1.575 m)      Body mass index is 23.41 kg/m.    Physical Exam:    General: Well-appearing, cooperative, sitting comfortably in no acute distress.   OMT  Physical Exam:  ASIS Compression Test: Positive Right Rib: Ribs 5-8 inhalation dysfunction on right Thoracic: TTP paraspinal, T6 RRSR Lumbar: TTP paraspinal, L2 RRSR  No flail chest.  No pain with full inhalation.  No sharp pain with palpation of spinous processes  Electronically signed by:  Wendy Mccreedy D.[facility] 11:43 AM 06/25/21

## 2021-06-29 DIAGNOSIS — R7982 Elevated C-reactive protein (CRP): Secondary | ICD-10-CM | POA: Diagnosis not present

## 2021-06-29 DIAGNOSIS — Z13220 Encounter for screening for lipoid disorders: Secondary | ICD-10-CM | POA: Diagnosis not present

## 2021-06-29 DIAGNOSIS — N951 Menopausal and female climacteric states: Secondary | ICD-10-CM | POA: Diagnosis not present

## 2021-06-29 DIAGNOSIS — E039 Hypothyroidism, unspecified: Secondary | ICD-10-CM | POA: Diagnosis not present

## 2021-06-29 DIAGNOSIS — E7211 Homocystinuria: Secondary | ICD-10-CM | POA: Diagnosis not present

## 2021-06-29 DIAGNOSIS — D696 Thrombocytopenia, unspecified: Secondary | ICD-10-CM | POA: Diagnosis not present

## 2021-06-29 DIAGNOSIS — E559 Vitamin D deficiency, unspecified: Secondary | ICD-10-CM | POA: Diagnosis not present

## 2021-06-29 DIAGNOSIS — R739 Hyperglycemia, unspecified: Secondary | ICD-10-CM | POA: Diagnosis not present

## 2021-06-29 DIAGNOSIS — M810 Age-related osteoporosis without current pathological fracture: Secondary | ICD-10-CM | POA: Diagnosis not present

## 2021-07-05 DIAGNOSIS — M545 Low back pain, unspecified: Secondary | ICD-10-CM | POA: Diagnosis not present

## 2021-07-05 DIAGNOSIS — M25512 Pain in left shoulder: Secondary | ICD-10-CM | POA: Diagnosis not present

## 2021-07-05 DIAGNOSIS — M25511 Pain in right shoulder: Secondary | ICD-10-CM | POA: Diagnosis not present

## 2021-07-06 NOTE — Progress Notes (Signed)
Wendy Elliott D.[place] Phone: [phone]   Assessment and Plan:     1. Contusion of chest wall, subsequent encounter 2. Somatic dysfunction of thoracic region 3. Somatic dysfunction of lumbar region 4. Somatic dysfunction of pelvic region -Acute, uncomplicated, subsequent visit - Multiple musculoskeletal complaints, generally improving after a metal shelf fell on patient.  Chest wall pain has significantly improved and multiple other muscular complaints are likely compensatory - Refill Flexeril 5 mg nightly for muscle spasms tightness - No red flag symptoms - Continue NSAIDs/Tylenol as needed for pain relief - Patient has received significant relief with OMT in the past.  Elects for repeat OMT today.  Tolerated well per note below. - Decision today to treat with OMT was based on Physical Exam  After verbal consent patient was treated with HVLA (high velocity low amplitude), ME (muscle energy), FPR (flex positional release), ST (soft tissue), PC/PD (Pelvic Compression/ Pelvic Decompression) techniques in thoracic, lumbar, and pelvic areas. Patient tolerated the procedure well with improvement in symptoms.  Patient educated on potential side effects of soreness and recommended to rest, hydrate, and use Tylenol as needed for pain control.    Pertinent previous records reviewed include none   Follow Up: As needed if no improvement or worsening of symptoms   Subjective:    Chief Complaint: Back pain   HPI:   06/25/21 Back pain started 3 weeks ago entire back. Right side is worse. Shelf fell on her. Chest is also sore and in pain. At night sharp pain and soreness. Constantly a dull ache. Nothing is really helping with the pain. Was given meloxicam, doesn't help.  Denies shortness of breath, dyspnea on exertion, left arm pain, jaw pain, syncope.  07/09/21 Patient states that she does feel better but still  having some pain but not as bad. Patient been seeing a massage therapist and has PT this week. Patient states the pain is more in her shoulders now.   Relevant Historical Information: none pertinent   Additional pertinent review of systems negative.   Current Outpatient Medications:    ALPRAZolam (XANAX) 0.25 MG tablet, Take 1 tablet (0.25 mg total) by mouth 2 (two) times daily as needed for anxiety., Disp: 30 tablet, Rfl: 0   Estradiol (ESTRACE VA), Place vaginally., Disp: , Rfl:    latanoprost (XALATAN) 0.005 % ophthalmic solution, Administer 1 drop into both eyes at bedtime., Disp: , Rfl:    ondansetron (ZOFRAN) 4 MG tablet, Take by mouth., Disp: , Rfl:    Probiotic Product (PROBIOTIC PO), Take by mouth., Disp: , Rfl:    Progesterone 40 % CREA, by Does not apply route., Disp: , Rfl:    Testosterone 20 % CREA, by Does not apply route., Disp: , Rfl:    Travoprost, BAK Free, (TRAVATAN) 0.004 % SOLN ophthalmic solution, Travatan Z 0.004 % eye drops  PLACE 1 DROP INTO RIGHT EYE QHS, Disp: , Rfl:    triamcinolone cream (KENALOG) 0.1 %, APPLY ON THE SKIN TWICE A DAY APPLY TO EARS AS NEEDED ITCHING, Disp: , Rfl:    valACYclovir (VALTREX) 1000 MG tablet, Take by mouth daily. As needed, Disp: , Rfl:    cyclobenzaprine (FLEXERIL) 5 MG tablet, Take 1 tablet (5 mg total) by mouth at bedtime., Disp: 10 tablet, Rfl: 0   Objective:     Vitals:   07/09/21 1059  BP: 110/72  Pulse: 71  SpO2: 99%  Weight: 126 lb (57.2  kg)  Height: 5\' 2"  (1.575 m)      Body mass index is 23.05 kg/m.    Physical Exam:    General: Well-appearing, cooperative, sitting comfortably in no acute distress.   OMT Physical Exam:  ASIS Compression Test: Positive Right Thoracic: TTP paraspinal, T4-8 RLSR Lumbar: TTP paraspinal, L2 RLSL Pelvis: Right anterior innominate    Electronically signed by:  Wendy Elliott D.[facility] 11:32 AM 07/09/21

## 2021-07-09 ENCOUNTER — Ambulatory Visit: Payer: PPO | Admitting: Sports Medicine

## 2021-07-09 ENCOUNTER — Other Ambulatory Visit: Payer: Self-pay

## 2021-07-09 VITALS — BP 110/72 | HR 71 | Ht 62.0 in | Wt 126.0 lb

## 2021-07-09 DIAGNOSIS — M9902 Segmental and somatic dysfunction of thoracic region: Secondary | ICD-10-CM

## 2021-07-09 DIAGNOSIS — M9903 Segmental and somatic dysfunction of lumbar region: Secondary | ICD-10-CM

## 2021-07-09 DIAGNOSIS — S20219D Contusion of unspecified front wall of thorax, subsequent encounter: Secondary | ICD-10-CM

## 2021-07-09 DIAGNOSIS — M9905 Segmental and somatic dysfunction of pelvic region: Secondary | ICD-10-CM

## 2021-07-09 MED ORDER — CYCLOBENZAPRINE HCL 5 MG PO TABS: 5.0000 mg | ORAL_TABLET | Freq: Every day | ORAL | 0 refills | Status: DC

## 2021-07-09 NOTE — Patient Instructions (Addendum)
Good to see you  I sent in a refill of the muscle relaxer  Follow up as needed

## 2021-07-11 DIAGNOSIS — M25512 Pain in left shoulder: Secondary | ICD-10-CM | POA: Diagnosis not present

## 2021-07-11 DIAGNOSIS — M25511 Pain in right shoulder: Secondary | ICD-10-CM | POA: Diagnosis not present

## 2021-07-11 DIAGNOSIS — R531 Weakness: Secondary | ICD-10-CM | POA: Diagnosis not present

## 2021-07-11 DIAGNOSIS — M545 Low back pain, unspecified: Secondary | ICD-10-CM | POA: Diagnosis not present

## 2021-07-13 DIAGNOSIS — M545 Low back pain, unspecified: Secondary | ICD-10-CM | POA: Diagnosis not present

## 2021-07-13 DIAGNOSIS — M25511 Pain in right shoulder: Secondary | ICD-10-CM | POA: Diagnosis not present

## 2021-07-13 DIAGNOSIS — R531 Weakness: Secondary | ICD-10-CM | POA: Diagnosis not present

## 2021-07-13 DIAGNOSIS — M25512 Pain in left shoulder: Secondary | ICD-10-CM | POA: Diagnosis not present

## 2021-07-16 DIAGNOSIS — M545 Low back pain, unspecified: Secondary | ICD-10-CM | POA: Diagnosis not present

## 2021-07-16 DIAGNOSIS — R531 Weakness: Secondary | ICD-10-CM | POA: Diagnosis not present

## 2021-07-16 DIAGNOSIS — M25512 Pain in left shoulder: Secondary | ICD-10-CM | POA: Diagnosis not present

## 2021-07-16 DIAGNOSIS — M25511 Pain in right shoulder: Secondary | ICD-10-CM | POA: Diagnosis not present

## 2021-07-24 DIAGNOSIS — M25512 Pain in left shoulder: Secondary | ICD-10-CM | POA: Diagnosis not present

## 2021-07-24 DIAGNOSIS — M25511 Pain in right shoulder: Secondary | ICD-10-CM | POA: Diagnosis not present

## 2021-07-24 DIAGNOSIS — M545 Low back pain, unspecified: Secondary | ICD-10-CM | POA: Diagnosis not present

## 2021-07-24 DIAGNOSIS — R531 Weakness: Secondary | ICD-10-CM | POA: Diagnosis not present

## 2021-07-26 DIAGNOSIS — M25512 Pain in left shoulder: Secondary | ICD-10-CM | POA: Diagnosis not present

## 2021-07-26 DIAGNOSIS — M545 Low back pain, unspecified: Secondary | ICD-10-CM | POA: Diagnosis not present

## 2021-07-26 DIAGNOSIS — M25511 Pain in right shoulder: Secondary | ICD-10-CM | POA: Diagnosis not present

## 2021-07-26 DIAGNOSIS — R531 Weakness: Secondary | ICD-10-CM | POA: Diagnosis not present

## 2021-08-02 DIAGNOSIS — M3119 Other thrombotic microangiopathy: Secondary | ICD-10-CM | POA: Diagnosis not present

## 2021-08-02 DIAGNOSIS — B960 Mycoplasma pneumoniae [M. pneumoniae] as the cause of diseases classified elsewhere: Secondary | ICD-10-CM | POA: Diagnosis not present

## 2021-08-02 DIAGNOSIS — U099 Post covid-19 condition, unspecified: Secondary | ICD-10-CM | POA: Diagnosis not present

## 2021-08-02 DIAGNOSIS — J160 Chlamydial pneumonia: Secondary | ICD-10-CM | POA: Diagnosis not present

## 2021-11-12 ENCOUNTER — Other Ambulatory Visit: Payer: Self-pay | Admitting: Internal Medicine

## 2021-11-12 DIAGNOSIS — F339 Major depressive disorder, recurrent, unspecified: Secondary | ICD-10-CM

## 2021-11-13 NOTE — Telephone Encounter (Signed)
Note 03/22/21 ?-We have agreed to try Wellbutrin 150 mg daily, I have advised continued CBT sessions.  She will return in 8 weeks for follow-up. ?Okay to refill? ?

## 2022-02-03 IMAGING — MG DIGITAL SCREENING BILAT W/ TOMO W/ CAD
8 series · 9 of 24 positions shown · non-contrast
Comparison: Previous exam(s).

CLINICAL DATA: Screening.

EXAM:
DIGITAL SCREENING BILATERAL MAMMOGRAM WITH TOMO AND CAD

[L CC synth-2D]
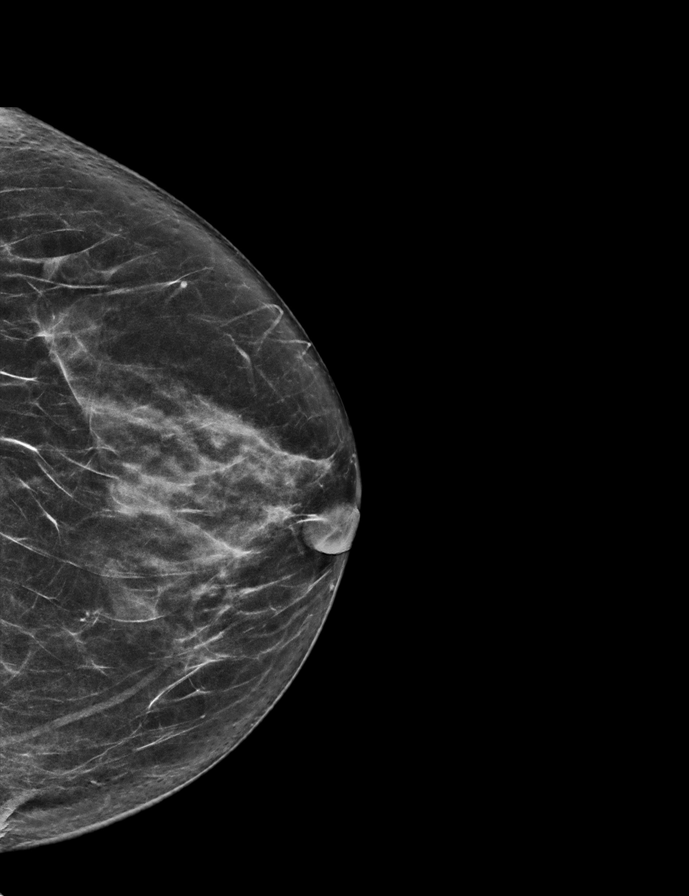

[R CC synth-2D]
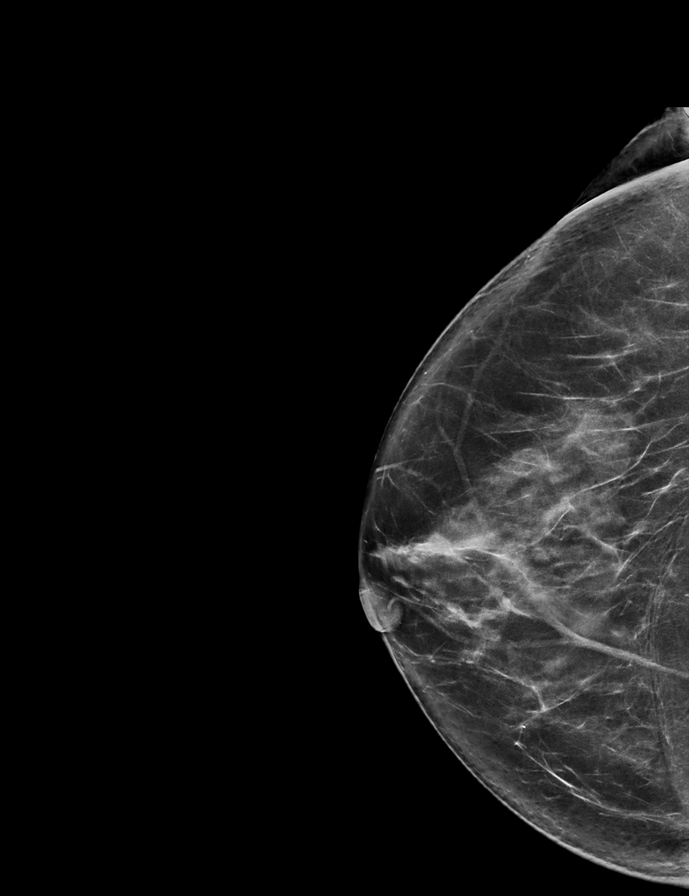

[L MLO synth-2D]
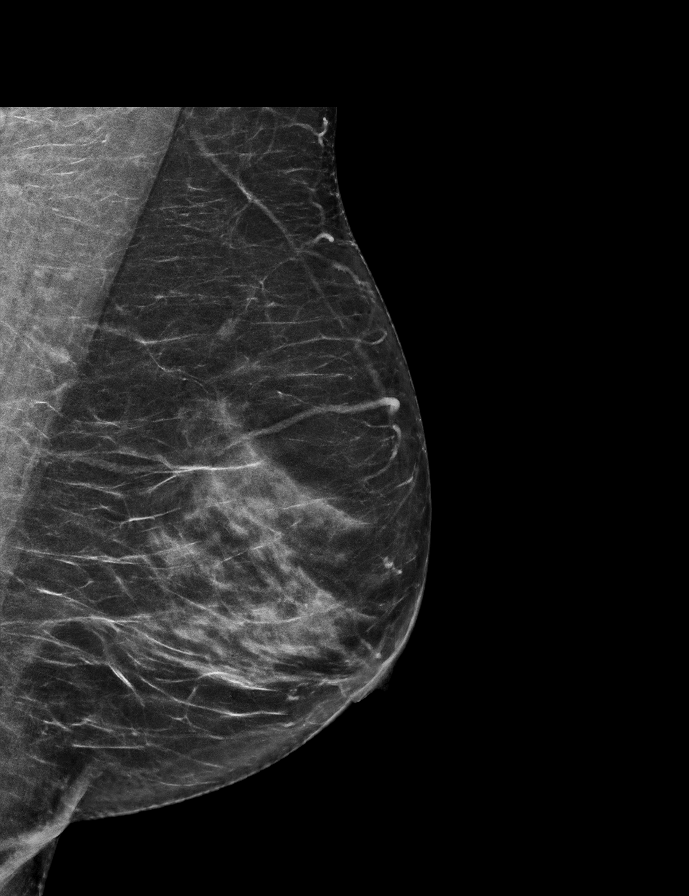

[R MLO synth-2D]
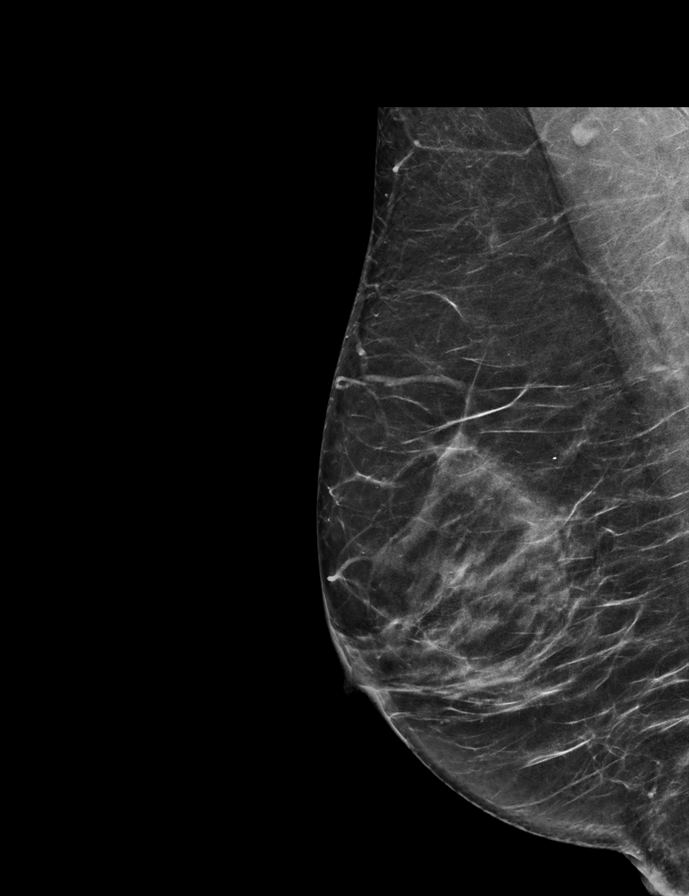

[L MLO tomo · 2 of 68 frames shown]
[frame 22/68]
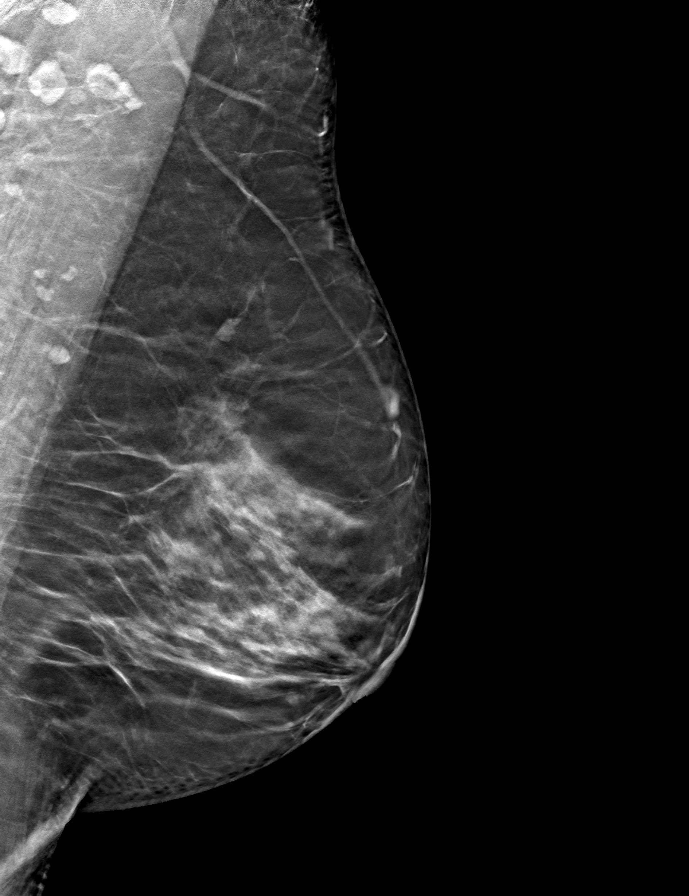
[frame 35/68]
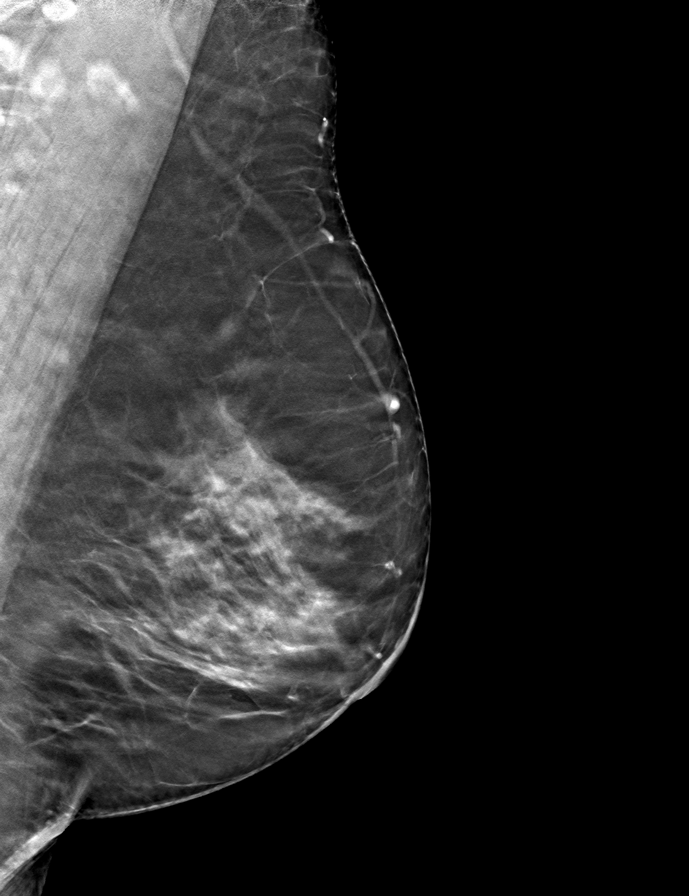

[L CC tomo · tomo slice 33/64.0]
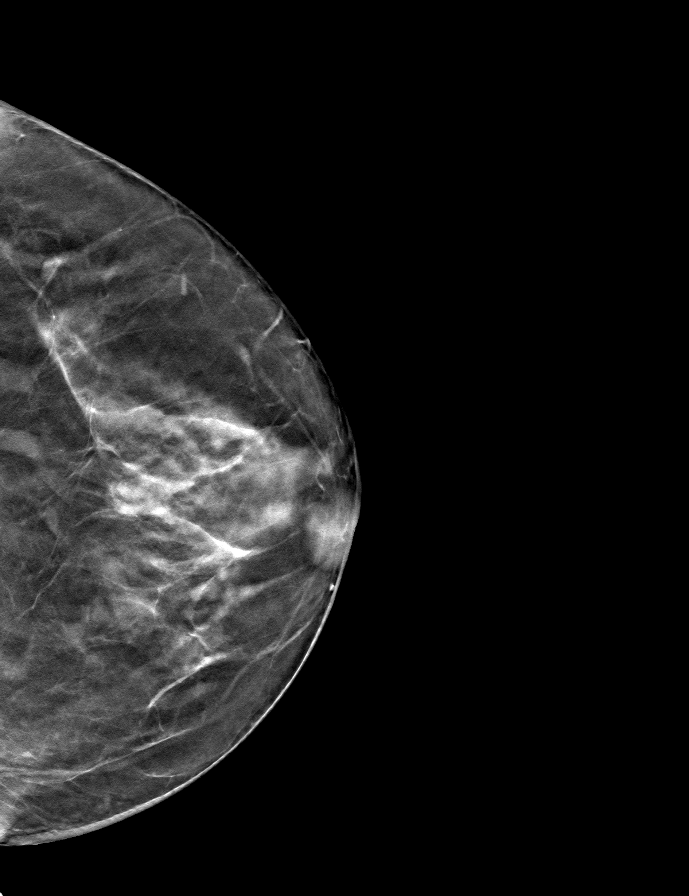

[R MLO tomo · tomo slice 33/65.0]
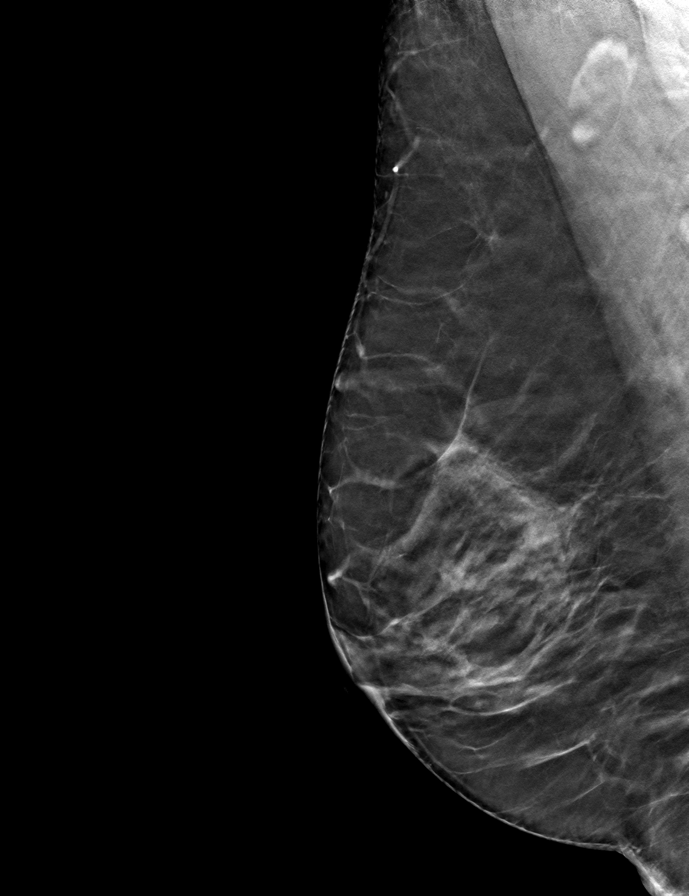

[R CC tomo · tomo slice 37/72.0]
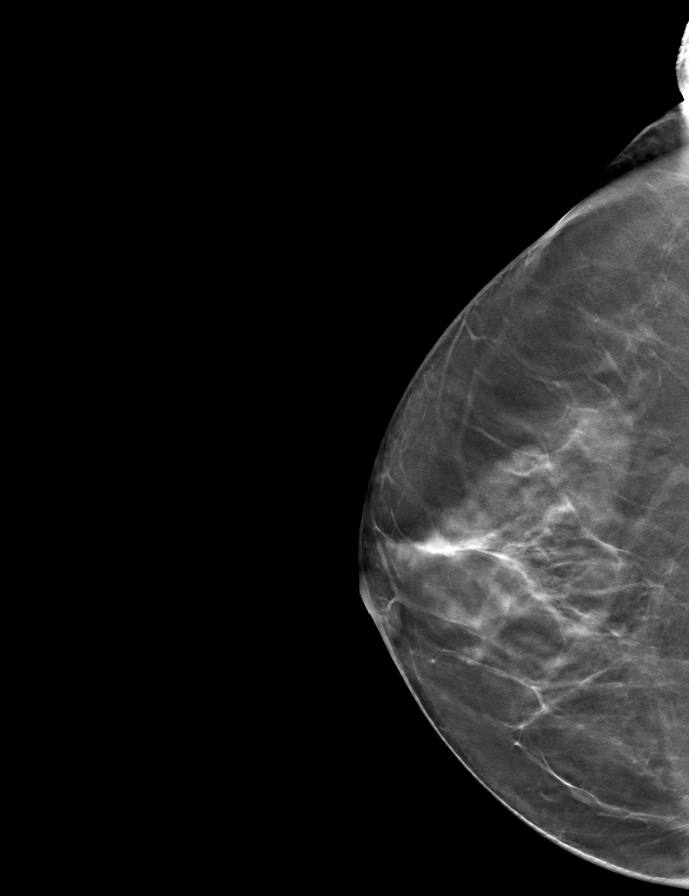

[9 of 24 positions shown; findings below may reference images not displayed]

ACR Breast Density Category c: The breast tissue is heterogeneously
dense, which may obscure small masses.
FINDINGS: There are no findings suspicious for malignancy. Images were
processed with CAD.
IMPRESSION: No mammographic evidence of malignancy. A result letter of this
screening mammogram will be mailed directly to the patient.

RECOMMENDATION:
Screening mammogram in one year. (Code:[id number]-[id number])

BI-RADS CATEGORY  1: Negative.

## 2022-11-13 ENCOUNTER — Telehealth: Payer: Self-pay | Admitting: Neurology

## 2022-11-13 ENCOUNTER — Telehealth: Payer: Self-pay

## 2022-11-13 NOTE — Telephone Encounter (Signed)
Received [facility] sleep referral for patient from [place] Dr. Gaynelle Cage. Placed in sleep referrals box

## 2022-11-13 NOTE — Telephone Encounter (Signed)
Received referral for sleep for patient but do not have any visit notes or information on referral/patient. Referring office notified

## 2022-11-21 NOTE — Therapy (Signed)
OUTPATIENT PHYSICAL THERAPY THORACOLUMBAR EVALUATION   Patient Name: Wendy Elliott MRN: [id number] DOB:05/25/1951, 72 y.o., female Today's Date: 11/26/2022  END OF SESSION:  PT End of Session - 11/26/22 1057     Visit Number 1    Number of Visits 8    Date for PT Re-Evaluation 01/21/23    Authorization Type [employer]    PT Start Time 1019    PT Stop Time 1058    PT Time Calculation (min) 39 min    Activity Tolerance Patient tolerated treatment well    Behavior During Therapy WFL for tasks assessed/performed             Past Medical History:  Diagnosis Date   Hormone replacement therapy    Hypothyroidism    Insomnia    Sleep apnea    History reviewed. No pertinent surgical history. Patient Active Problem List   Diagnosis Date Noted   Depression, recurrent 03/22/2021    PCP: Isaac Bliss, Rayford Halsted, MD  REFERRING PROVIDER: Melina Schools, MD  REFERRING DIAG: Vertebrogenic low back pain [M54.51]   Rationale for Evaluation and Treatment: Rehabilitation  THERAPY DIAG:  Other low back pain  Pain in thoracic spine  Muscle weakness (generalized)  ONSET DATE: Ongoing with recent onset 4 months ago.   SUBJECTIVE:                                                                                                                                                                                           SUBJECTIVE STATEMENT: Pt reports acute on chronic back pain. She reports a history of trauma; falling off a ladder, falling off a horse and having a cart fall on top of her in the garage. Most recent was 2 years ago. Pt has received acupuncture in the past, but is interested in dry needling.   PERTINENT HISTORY:  Hormone replacement therapy, Hypothyroidism, Insomnia, Sleep apnea  PAIN:  Are you having pain? Yes: NPRS scale: 6-7/10 Pain location: Thoracic spine to lumbar.  Pain description: Tight.  Aggravating factors: Lifting, carrying,  painting, pushing, pulling. Rolling out of bed.   Relieving factors: Rest.   PRECAUTIONS: None  WEIGHT BEARING RESTRICTIONS: No  FALLS:  Has patient fallen in last 6 months? 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   PLOF: Independent  PATIENT GOALS: Pt would like to minimize pain.    OBJECTIVE:   DIAGNOSTIC FINDINGS:  None recent.   PATIENT SURVEYS:  FOTO 67.04%,  68% predicted in 10 visits   SCREENING FOR RED FLAGS: Bowel or bladder incontinence: No  COGNITION: Overall cognitive status: Within functional limits for tasks assessed     SENSATION: WFL  MUSCLE LENGTH: Hamstrings: Right 90% deg; Left 90%  deg   POSTURE: No Significant postural limitations  PALPATION:  Significant tension in thoracic and lumbar paraspinals.   LUMBAR ROM:   FULL ROM in lumbar with pt reporting tension throughout.   (Blank rows = not tested)  LOWER EXTREMITY ROM:     FULL ROM throughout bilateral LE  (Blank rows = not tested)  LOWER EXTREMITY MMT:    MMT Right eval Left eval  Hip flexion 4+ 4+  Hip abduction    Knee flexion 5 5  Knee extension 5 5   (Blank rows = not tested)  GAIT: Distance walked: 72ft  Assistive device utilized: None Level of assistance: Complete Independence Comments: Normal   TODAY'S TREATMENT:                                                                                                                              DATE: Creating, reviewing, and completing below HEP    PATIENT EDUCATION:  Education details: Educated pt on anatomy and physiology of current symptoms, FOTO, diagnosis, prognosis, HEP,  and POC. Person educated: Patient Education method: Customer service manager Education comprehension: verbalized understanding and returned  demonstration  HOME EXERCISE PROGRAM: Access Code: [email or web address] URL: [email or web address] Date: 11/26/2022 Prepared by: Rudi Heap  Exercises - Sidelying Thoracic Rotation with Open Book  - 2 x daily - 7 x weekly - 2 sets - 10 reps - Cat Cow  - 2 x daily - 7 x weekly - 2 sets - 10 reps   ASSESSMENT:  CLINICAL IMPRESSION: Patient referred to PT for thoracic and lumbar muscle tension. She demonstrates full functional strength and ROM, but is limited with tension at end ranges. Pt with significant tension with palpation in thoracic and lumbar paraspinals. She is very interested in TPDN and would benefit from postural control and core strengthening. Patient will benefit from skilled PT to address below impairments, limitations and improve overall function.  OBJECTIVE IMPAIRMENTS: decreased activity tolerance, difficulty walking, decreased balance, decreased endurance, decreased mobility, decreased ROM, decreased strength, impaired flexibility, impaired UE/LE use, postural dysfunction, and pain.  ACTIVITY LIMITATIONS: bending, lifting, carry, locomotion, cleaning, community activity, driving, and or occupation  PERSONAL FACTORS: Hormone replacement therapy, Hypothyroidism, Insomnia, Sleep apnea are also affecting patient's functional outcome.  REHAB POTENTIAL: Good  CLINICAL DECISION MAKING: Stable/uncomplicated  EVALUATION COMPLEXITY: Low    GOALS: Short term PT Goals Target date: 12/10/2022  Pt will be I and compliant with HEP. Baseline:  Goal status: New Pt will decrease pain by 25% overall Baseline: Goal status: New  Long term PT goals Target date: 01/21/2023 Pt will improve ROM without reports of sitffness to improve functional mobility Baseline: Goal status: New Pt will improve  hip strength to  at least 5-/5 MMT to improve functional strength Baseline: Goal status: New Pt will improve FOTO to at least 68% functional to show improved function Baseline: Goal  status: New Pt will reduce pain by overall 50% overall with usual activity Baseline: Goal status: New Pt will return to golf with 2-3/ 10 pain and stiffness. Baseline: Goal status: New  PLAN: PT FREQUENCY: 1-2 times per week   PT DURATION: 6-8 weeks  PLANNED INTERVENTIONS (unless contraindicated): aquatic PT, Canalith repositioning, cryotherapy, Electrical stimulation, Iontophoresis with 4 mg/ml dexamethasome, Moist heat, traction, Ultrasound, gait training, Therapeutic exercise, balance training, neuromuscular re-education, patient/family education, prosthetic training, manual techniques, passive ROM, dry needling, taping, vasopnuematic device, vestibular, spinal manipulations, joint manipulations  PLAN FOR NEXT SESSION: Update/ review HEP PRN, TPDN, throacic and lumbar mobility. Core strengthening.     Lynden Ang, PT 11/26/2022, 10:58 AM

## 2022-11-26 ENCOUNTER — Encounter (HOSPITAL_BASED_OUTPATIENT_CLINIC_OR_DEPARTMENT_OTHER): Payer: Self-pay | Admitting: Physical Therapy

## 2022-11-26 ENCOUNTER — Ambulatory Visit (HOSPITAL_BASED_OUTPATIENT_CLINIC_OR_DEPARTMENT_OTHER): Payer: PPO | Attending: Orthopedic Surgery | Admitting: Physical Therapy

## 2022-11-26 ENCOUNTER — Other Ambulatory Visit: Payer: Self-pay

## 2022-11-26 DIAGNOSIS — M6281 Muscle weakness (generalized): Secondary | ICD-10-CM | POA: Diagnosis present

## 2022-11-26 DIAGNOSIS — M546 Pain in thoracic spine: Secondary | ICD-10-CM | POA: Diagnosis present

## 2022-11-26 DIAGNOSIS — M5459 Other low back pain: Secondary | ICD-10-CM

## 2022-12-03 ENCOUNTER — Ambulatory Visit (HOSPITAL_BASED_OUTPATIENT_CLINIC_OR_DEPARTMENT_OTHER): Payer: PPO | Admitting: Physical Therapy

## 2022-12-03 ENCOUNTER — Encounter (HOSPITAL_BASED_OUTPATIENT_CLINIC_OR_DEPARTMENT_OTHER): Payer: Self-pay | Admitting: Physical Therapy

## 2022-12-03 DIAGNOSIS — M6281 Muscle weakness (generalized): Secondary | ICD-10-CM

## 2022-12-03 DIAGNOSIS — M5459 Other low back pain: Secondary | ICD-10-CM

## 2022-12-03 DIAGNOSIS — M546 Pain in thoracic spine: Secondary | ICD-10-CM

## 2022-12-03 NOTE — Therapy (Signed)
OUTPATIENT PHYSICAL THERAPY THORACOLUMBAR Treatment    Patient Name: Wendy Elliott MRN: [id number] DOB:1951-06-05, 72 y.o., female Today's Date: 12/04/2022  END OF SESSION:  PT End of Session - 12/03/22 1505     Visit Number 2    Number of Visits 8    Date for PT Re-Evaluation 01/21/23    Authorization Type [employer]    PT Start Time 1430    PT Stop Time 1512    PT Time Calculation (min) 42 min    Activity Tolerance Patient tolerated treatment well    Behavior During Therapy WFL for tasks assessed/performed              Past Medical History:  Diagnosis Date   Hormone replacement therapy    Hypothyroidism    Insomnia    Sleep apnea    History reviewed. No pertinent surgical history. Patient Active Problem List   Diagnosis Date Noted   Depression, recurrent 03/22/2021    PCP: Philip Aspen, Limmie Patricia, MD  REFERRING PROVIDER: Venita Lick, MD  REFERRING DIAG: Vertebrogenic low back pain [M54.51]   Rationale for Evaluation and Treatment: Rehabilitation  THERAPY DIAG:  Other low back pain  Pain in thoracic spine  Muscle weakness (generalized)  ONSET DATE: Ongoing with recent onset 4 months ago.   SUBJECTIVE:                                                                                                                                                                                           SUBJECTIVE STATEMENT: Patient reports her back is about the same. She reports she has not been able to find the sheet so she hasn't done the things given to her.   Eval Pt reports acute on chronic back pain. She reports a history of trauma; falling off a ladder, falling off a horse and having a cart fall on top of her in the garage. Most recent was 2 years ago. Pt has received acupuncture in the past, but is interested in dry needling.   PERTINENT HISTORY:  Hormone replacement therapy, Hypothyroidism, Insomnia, Sleep apnea  PAIN:  Are you  having pain? Yes: NPRS scale: 6-7/10 Pain location: Thoracic spine to lumbar.  Pain description: Tight.  Aggravating factors: Lifting, carrying, painting, pushing, pulling. Rolling out of bed.   Relieving factors: Rest.   PRECAUTIONS: None  WEIGHT BEARING RESTRICTIONS: No  FALLS:  Has patient fallen in last 6 months? 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   PLOF: Independent  PATIENT GOALS: Pt would like to minimize pain.    OBJECTIVE:   DIAGNOSTIC FINDINGS:  None recent.   PATIENT SURVEYS:  FOTO 67.04%, 68% predicted in 10 visits   SCREENING FOR RED FLAGS: Bowel or bladder incontinence: No  COGNITION: Overall cognitive status: Within functional limits for tasks assessed     SENSATION: WFL  MUSCLE LENGTH: Hamstrings: Right 90% deg; Left 90%  deg   POSTURE: No Significant postural limitations  PALPATION:  Significant tension in thoracic and lumbar paraspinals.   LUMBAR ROM:   FULL ROM in lumbar with pt reporting tension throughout.   (Blank rows = not tested)  LOWER EXTREMITY ROM:     FULL ROM throughout bilateral LE  (Blank rows = not tested)  LOWER EXTREMITY MMT:    MMT Right eval Left eval  Hip flexion 4+ 4+  Hip abduction    Knee flexion 5 5  Knee extension 5 5   (Blank rows = not tested)  GAIT: Distance walked: 6650ft  Assistive device utilized: None Level of assistance: Complete Independence Comments: Normal   TODAY'S TREATMENT:                                                                                                                              DATE: Creating, reviewing, and completing below HEP   4/9 Nustep L4 5mins  Trigger Point Dry-Needling  Treatment instructions: Expect mild to moderate muscle soreness. S/S of pneumothorax if dry  needled over a lung field, and to seek immediate medical attention should they occur. Patient verbalized understanding of these instructions and education.  Patient Consent Given: Yes Education handout provided: Yes Muscles treated: left and right L5 paraspinals; bilateral gluteals; bilateral thoracic paraspinals mid;  Electrical stimulation performed: No Parameters: N/A Treatment response/outcome: great tiwtch with all needles   Manual: skilled palpation of trigger points; trigger point release to thoracic and lower back paraspinals.   LTR 2x10 Piriformis stretch 3x30 sec each  Sink stretch 2x20 sec hold Tennis ball trigger point release  Bridge 2x10 SLR 2x10 bilateral   Reviewed HEP and how to use it     PATIENT EDUCATION:  Education details: Educated pt on anatomy and physiology of current symptoms, FOTO, diagnosis, prognosis, HEP,  and POC. Person educated: Patient Education method: Medical illustratorxplanation and Demonstration Education comprehension: verbalized understanding and returned demonstration  HOME EXERCISE PROGRAM: Access Code: [email or web address] URL: [email or web address] Date: 11/26/2022 Prepared by: Royal HawthornSimone Yuds  Exercises - Sidelying Thoracic Rotation with Open Book  - 2 x daily - 7 x weekly - 2 sets - 10 reps - Cat Cow  - 2 x daily - 7 x weekly - 2 sets - 10 reps   ASSESSMENT:  CLINICAL IMPRESSION: Therapy performed trigger point dry needling to the patients lumbar and thoracic paraspinals. Patient was given new stretches to trial at home. She was advised to pick 1-2 to try when she is hurting and  to circle the ones that improve her pain. She was advised to focus on those stretches. She was also given two base strengthening exercises. Therapy will continue to progress as tolerated.   OBJECTIVE IMPAIRMENTS: decreased activity tolerance, difficulty walking, decreased balance, decreased endurance, decreased mobility, decreased ROM, decreased strength, impaired  flexibility, impaired UE/LE use, postural dysfunction, and pain.  ACTIVITY LIMITATIONS: bending, lifting, carry, locomotion, cleaning, community activity, driving, and or occupation  PERSONAL FACTORS: Hormone replacement therapy, Hypothyroidism, Insomnia, Sleep apnea are also affecting patient's functional outcome.  REHAB POTENTIAL: Good  CLINICAL DECISION MAKING: Stable/uncomplicated  EVALUATION COMPLEXITY: Low    GOALS: Short term PT Goals Target date: 12/10/2022  Pt will be I and compliant with HEP. Baseline:  Goal status: New Pt will decrease pain by 25% overall Baseline: Goal status: New  Long term PT goals Target date: 01/21/2023 Pt will improve ROM without reports of sitffness to improve functional mobility Baseline: Goal status: New Pt will improve  hip strength to at least 5-/5 MMT to improve functional strength Baseline: Goal status: New Pt will improve FOTO to at least 68% functional to show improved function Baseline: Goal status: New Pt will reduce pain by overall 50% overall with usual activity Baseline: Goal status: New Pt will return to golf with 2-3/ 10 pain and stiffness. Baseline: Goal status: New  PLAN: PT FREQUENCY: 1-2 times per week   PT DURATION: 6-8 weeks  PLANNED INTERVENTIONS (unless contraindicated): aquatic PT, Canalith repositioning, cryotherapy, Electrical stimulation, Iontophoresis with 4 mg/ml dexamethasome, Moist heat, traction, Ultrasound, gait training, Therapeutic exercise, balance training, neuromuscular re-education, patient/family education, prosthetic training, manual techniques, passive ROM, dry needling, taping, vasopnuematic device, vestibular, spinal manipulations, joint manipulations  PLAN FOR NEXT SESSION: Update/ review HEP PRN, TPDN, throacic and lumbar mobility. Core strengthening.     Dessie Coma, PT 12/04/2022, 10:45 AM

## 2022-12-04 ENCOUNTER — Encounter (HOSPITAL_BASED_OUTPATIENT_CLINIC_OR_DEPARTMENT_OTHER): Payer: Self-pay | Admitting: Physical Therapy

## 2022-12-11 ENCOUNTER — Encounter (HOSPITAL_BASED_OUTPATIENT_CLINIC_OR_DEPARTMENT_OTHER): Payer: Self-pay | Admitting: Physical Therapy

## 2022-12-11 ENCOUNTER — Ambulatory Visit (HOSPITAL_BASED_OUTPATIENT_CLINIC_OR_DEPARTMENT_OTHER): Payer: PPO | Admitting: Physical Therapy

## 2022-12-11 DIAGNOSIS — M5459 Other low back pain: Secondary | ICD-10-CM

## 2022-12-11 DIAGNOSIS — M546 Pain in thoracic spine: Secondary | ICD-10-CM

## 2022-12-11 DIAGNOSIS — M6281 Muscle weakness (generalized): Secondary | ICD-10-CM

## 2022-12-11 NOTE — Therapy (Signed)
OUTPATIENT PHYSICAL THERAPY THORACOLUMBAR Treatment    Patient Name: Wendy Elliott MRN: [id number] DOB:November 12, 1950, 72 y.o., female Today's Date: 12/11/2022  END OF SESSION:  PT End of Session - 12/11/22 1518     Visit Number 3    Number of Visits 8    Date for PT Re-Evaluation 01/21/23    Authorization Type [employer]    PT Start Time 1433    PT Stop Time 1514    PT Time Calculation (min) 41 min    Activity Tolerance Patient tolerated treatment well    Behavior During Therapy WFL for tasks assessed/performed              Past Medical History:  Diagnosis Date   Hormone replacement therapy    Hypothyroidism    Insomnia    Sleep apnea    No past surgical history on file. Patient Active Problem List   Diagnosis Date Noted   Depression, recurrent 03/22/2021    PCP: Philip Aspen, Limmie Patricia, MD  REFERRING PROVIDER: Venita Lick, MD  REFERRING DIAG: Vertebrogenic low back pain [M54.51]   Rationale for Evaluation and Treatment: Rehabilitation  THERAPY DIAG:  No diagnosis found.  ONSET DATE: Ongoing with recent onset 4 months ago.   SUBJECTIVE:                                                                                                                                                                                           SUBJECTIVE STATEMENT: Patient reports her back is about the same.She can not tell if dry needling has made a difference.  Eval Pt reports acute on chronic back pain. She reports a history of trauma; falling off a ladder, falling off a horse and having a cart fall on top of her in the garage. Most recent was 2 years ago. Pt has received acupuncture in the past, but is interested in dry needling.   PERTINENT HISTORY:  Hormone replacement therapy, Hypothyroidism, Insomnia, Sleep apnea  PAIN:  Are you having pain? Yes: NPRS scale: 6-7/10 Pain location: Thoracic spine to lumbar.  Pain description: Tight.  Aggravating  factors: Lifting, carrying, painting, pushing, pulling. Rolling out of bed.   Relieving factors: Rest.   PRECAUTIONS: None  WEIGHT BEARING RESTRICTIONS: No  FALLS:  Has patient fallen in last 6 months? 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   PLOF: Independent  PATIENT GOALS: Pt would like to minimize pain.    OBJECTIVE:  DIAGNOSTIC FINDINGS:  None recent.   PATIENT SURVEYS:  FOTO 67.04%, 68% predicted in 10 visits   SCREENING FOR RED FLAGS: Bowel or bladder incontinence: No  COGNITION: Overall cognitive status: Within functional limits for tasks assessed     SENSATION: WFL  MUSCLE LENGTH: Hamstrings: Right 90% deg; Left 90%  deg   POSTURE: No Significant postural limitations  PALPATION:  Significant tension in thoracic and lumbar paraspinals.   LUMBAR ROM:   FULL ROM in lumbar with pt reporting tension throughout.   (Blank rows = not tested)  LOWER EXTREMITY ROM:     FULL ROM throughout bilateral LE  (Blank rows = not tested)  LOWER EXTREMITY MMT:    MMT Right eval Left eval  Hip flexion 4+ 4+  Hip abduction    Knee flexion 5 5  Knee extension 5 5   (Blank rows = not tested)  GAIT: Distance walked: 59ft  Assistive device utilized: None Level of assistance: Complete Independence Comments: Normal   TODAY'S TREATMENT:                                                                                                                              DATE: Creating, reviewing, and completing below HEP   4/17 Trigger Point Dry-Needling  Treatment instructions: Expect mild to moderate muscle soreness. S/S of pneumothorax if dry needled over a lung field, and to seek immediate medical attention should they occur. Patient verbalized understanding of these instructions  and education.  Patient Consent Given: Yes Education handout provided: Yes Muscles treated: left and right L5 paraspinals; bilateral gluteals; bilateral thoracic paraspinals mid;  Electrical stimulation performed: No Parameters: N/A Treatment response/outcome: great tiwtch with all needles   Manual: skilled palpation of trigger points; trigger point release to thoracic and lower back paraspinals.   Rows red band 2x10 Shoulder Extension red band 2x10  Palloff press green band x10 each side     4/9 Nustep L4  Trigger Point Dry-Needling  Treatment instructions: Expect mild to moderate muscle soreness. S/S of pneumothorax if dry needled over a lung field, and to seek immediate medical attention should they occur. Patient verbalized understanding of these instructions and education.  Patient Consent Given: Yes Education handout provided: Yes Muscles treated: left and right L5 paraspinals; bilateral gluteals; bilateral thoracic paraspinals mid;  Electrical stimulation performed: No Parameters: N/A Treatment response/outcome: great tiwtch with all needles   Manual: skilled palpation of trigger points; trigger point release to thoracic and lower back paraspinals.   LTR 2x10 Piriformis stretch 3x30 sec each  Sink stretch 2x20 sec hold Tennis ball trigger point release  Bridge 2x10 SLR 2x10 bilateral   Reviewed HEP and how to use it     PATIENT EDUCATION:  Education details: Educated pt on anatomy and physiology of current symptoms, FOTO, diagnosis, prognosis, HEP,  and POC. Person educated: Patient Education method: Medical illustrator Education comprehension: verbalized understanding and returned demonstration  HOME EXERCISE  PROGRAM: Access Code: [email or web address] URL: [email or web address] Date: 11/26/2022 Prepared by: Royal Hawthorn  Exercises - Sidelying Thoracic Rotation with Open Book  - 2 x daily - 7 x weekly - 2 sets - 10 reps - Cat Cow  -  2 x daily - 7 x weekly - 2 sets - 10 reps   ASSESSMENT:  CLINICAL IMPRESSION: Therapy performed trigger point dry needling to the patients lumbar and thoracic paraspinals. Patient had great response and twitches from TPDN. Patient was given 3 band exercise to do at home to strengthen her core. Patient was informed on proper form and breathing technique to engage the core. Patient now has multiple exercise, she has been advised to pick 3 to do.   OBJECTIVE IMPAIRMENTS: decreased activity tolerance, difficulty walking, decreased balance, decreased endurance, decreased mobility, decreased ROM, decreased strength, impaired flexibility, impaired UE/LE use, postural dysfunction, and pain.  ACTIVITY LIMITATIONS: bending, lifting, carry, locomotion, cleaning, community activity, driving, and or occupation  PERSONAL FACTORS: Hormone replacement therapy, Hypothyroidism, Insomnia, Sleep apnea are also affecting patient's functional outcome.  REHAB POTENTIAL: Good  CLINICAL DECISION MAKING: Stable/uncomplicated  EVALUATION COMPLEXITY: Low    GOALS: Short term PT Goals Target date: 12/10/2022  Pt will be I and compliant with HEP. Baseline:  Goal status: New Pt will decrease pain by 25% overall Baseline: Goal status: New  Long term PT goals Target date: 01/21/2023 Pt will improve ROM without reports of sitffness to improve functional mobility Baseline: Goal status: New Pt will improve  hip strength to at least 5-/5 MMT to improve functional strength Baseline: Goal status: New Pt will improve FOTO to at least 68% functional to show improved function Baseline: Goal status: New Pt will reduce pain by overall 50% overall with usual activity Baseline: Goal status: New Pt will return to golf with 2-3/ 10 pain and stiffness. Baseline: Goal status: New  PLAN: PT FREQUENCY: 1-2 times per week   PT DURATION: 6-8 weeks  PLANNED INTERVENTIONS (unless contraindicated): aquatic PT, Canalith  repositioning, cryotherapy, Electrical stimulation, Iontophoresis with 4 mg/ml dexamethasome, Moist heat, traction, Ultrasound, gait training, Therapeutic exercise, balance training, neuromuscular re-education, patient/family education, prosthetic training, manual techniques, passive ROM, dry needling, taping, vasopnuematic device, vestibular, spinal manipulations, joint manipulations  PLAN FOR NEXT SESSION: Consider TPDN, throacic and lumbar mobility. Core strengthening.     Cristal Ford, Student-PT 12/11/2022, 3:22 PM

## 2022-12-17 ENCOUNTER — Encounter (HOSPITAL_BASED_OUTPATIENT_CLINIC_OR_DEPARTMENT_OTHER): Payer: Self-pay | Admitting: Physical Therapy

## 2022-12-17 ENCOUNTER — Ambulatory Visit (HOSPITAL_BASED_OUTPATIENT_CLINIC_OR_DEPARTMENT_OTHER): Payer: PPO | Admitting: Physical Therapy

## 2022-12-17 DIAGNOSIS — M546 Pain in thoracic spine: Secondary | ICD-10-CM

## 2022-12-17 DIAGNOSIS — M5459 Other low back pain: Secondary | ICD-10-CM | POA: Diagnosis not present

## 2022-12-17 DIAGNOSIS — M6281 Muscle weakness (generalized): Secondary | ICD-10-CM

## 2022-12-17 NOTE — Therapy (Signed)
OUTPATIENT PHYSICAL THERAPY THORACOLUMBAR Treatment    Patient Name: Wendy Elliott MRN: [id number] DOB:[birth year], 72 y.o., female Today's Date: 12/17/2022  END OF SESSION:  PT End of Session - 12/17/22 0958     Visit Number 5    Number of Visits 8    Date for PT Re-Evaluation 01/21/23    Authorization Type [employer]    PT Start Time 0933    PT Stop Time 1015    PT Time Calculation (min) 42 min    Activity Tolerance Patient tolerated treatment well    Behavior During Therapy WFL for tasks assessed/performed               Past Medical History:  Diagnosis Date   Hormone replacement therapy    Hypothyroidism    Insomnia    Sleep apnea    History reviewed. No pertinent surgical history. Patient Active Problem List   Diagnosis Date Noted   Depression, recurrent 03/22/2021    PCP: Philip Aspen, Limmie Patricia, MD  REFERRING PROVIDER: Venita Lick, MD  REFERRING DIAG: Vertebrogenic low back pain [M54.51]   Rationale for Evaluation and Treatment: Rehabilitation  THERAPY DIAG:  Other low back pain  Pain in thoracic spine  Muscle weakness (generalized)  ONSET DATE: Ongoing with recent onset 4 months ago.   SUBJECTIVE:                                                                                                                                                                                           SUBJECTIVE STATEMENT: Patient is feeling much better today. Reports that dry needling has helped her. Patient is able to walk with less discomfort.   Eval Pt reports acute on chronic back pain. She reports a history of trauma; falling off a ladder, falling off a horse and having a cart fall on top of her in the garage. Most recent was 2 years ago. Pt has received acupuncture in the past, but is interested in dry needling.   PERTINENT HISTORY:  Hormone replacement therapy, Hypothyroidism, Insomnia, Sleep apnea  PAIN:  Are you having pain?  Yes: NPRS scale: 6-7/10 Pain location: Thoracic spine to lumbar.  Pain description: Tight.  Aggravating factors: Lifting, carrying, painting, pushing, pulling. Rolling out of bed.   Relieving factors: Rest.   PRECAUTIONS: None  WEIGHT BEARING RESTRICTIONS: No  FALLS:  Has patient fallen in last 6 months? 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   PLOF: Independent  PATIENT GOALS: Pt would like to minimize pain.    OBJECTIVE:   DIAGNOSTIC FINDINGS:  None recent.   PATIENT SURVEYS:  FOTO 67.04%, 68% predicted in 10 visits   SCREENING FOR RED FLAGS: Bowel or bladder incontinence: No  COGNITION: Overall cognitive status: Within functional limits for tasks assessed     SENSATION: WFL  MUSCLE LENGTH: Hamstrings: Right 90% deg; Left 90%  deg   POSTURE: No Significant postural limitations  PALPATION:  Significant tension in thoracic and lumbar paraspinals.   LUMBAR ROM:   FULL ROM in lumbar with pt reporting tension throughout.   (Blank rows = not tested)  LOWER EXTREMITY ROM:     FULL ROM throughout bilateral LE  (Blank rows = not tested)  LOWER EXTREMITY MMT:    MMT Right eval Left eval  Hip flexion 4+ 4+  Hip abduction    Knee flexion 5 5  Knee extension 5 5   (Blank rows = not tested)  GAIT: Distance walked: 35ft  Assistive device utilized: None Level of assistance: Complete Independence Comments: Normal   TODAY'S TREATMENT:                                                                                                                              DATE: Creating, reviewing, and completing below HEP   4/23  Trigger Point Dry-Needling  Treatment instructions: Expect mild to moderate muscle soreness. S/S of pneumothorax if dry needled over a lung field, and to  seek immediate medical attention should they occur. Patient verbalized understanding of these instructions and education.  Patient Consent Given: Yes Education handout provided: Yes Muscles treated: left and right L5 paraspinals; bilateral gluteals; bilateral thoracic paraspinals mid;  Electrical stimulation performed: No Parameters: N/A Treatment response/outcome: great tiwtch with all needles   Manual: skilled palpation of trigger points; trigger point release to thoracic and lower back paraspinals.   422 steps on the nu step GR baseline 102 after 106 Triceps extension machin 40lbs 2x10 Row machine 30 lbs   Education provided on transverse abdominal activation. Patient shown picutres of transverse abdomianl muslces and educated on how to breath to maximize contraction.   4/17 Trigger Point Dry-Needling  Treatment instructions: Expect mild to moderate muscle soreness. S/S of pneumothorax if dry needled over a lung field, and to seek immediate medical attention should they occur. Patient verbalized understanding of these instructions and education.  Patient Consent Given: Yes Education handout provided: Yes Muscles treated: left and right L5 paraspinals; bilateral gluteals; bilateral thoracic paraspinals mid;  Electrical stimulation performed: No Parameters: N/A Treatment response/outcome: great tiwtch with all needles   Manual: skilled palpation of trigger points; trigger point release to thoracic and lower back paraspinals.   Rows red band 2x10 Shoulder Extension red band 2x10  Palloff press green band x10 each side     4/9 Nustep L4  Trigger Point Dry-Needling  Treatment instructions: Expect mild to moderate muscle soreness. S/S of  pneumothorax if dry needled over a lung field, and to seek immediate medical attention should they occur. Patient verbalized understanding of these instructions and education.  Patient Consent Given: Yes Education handout provided:  Yes Muscles treated: left and right L5 paraspinals; bilateral gluteals; bilateral thoracic paraspinals mid;  Electrical stimulation performed: No Parameters: N/A Treatment response/outcome: great tiwtch with all needles   Manual: skilled palpation of trigger points; trigger point release to thoracic and lower back paraspinals.   LTR 2x10 Piriformis stretch 3x30 sec each  Sink stretch 2x20 sec hold Tennis ball trigger point release  Bridge 2x10 SLR 2x10 bilateral   Reviewed HEP and how to use it     PATIENT EDUCATION:  Education details: Educated pt on anatomy and physiology of current symptoms, FOTO, diagnosis, prognosis, HEP,  and POC. Person educated: Patient Education method: Medical illustrator Education comprehension: verbalized understanding and returned demonstration  HOME EXERCISE PROGRAM: Access Code: [email or web address] URL: [email or web address] Date: 11/26/2022 Prepared by: Royal Hawthorn  Exercises - Sidelying Thoracic Rotation with Open Book  - 2 x daily - 7 x weekly - 2 sets - 10 reps - Cat Cow  - 2 x daily - 7 x weekly - 2 sets - 10 reps   ASSESSMENT:  CLINICAL IMPRESSION: Therapy performed trigger point dry needling to the patients lumbar and thoracic paraspinals. Patient had great response and twitches from TPDN. Patient heart rate at entry was 102bpm, after 5 mins on the nu step on L4 patient heart increased to 106. Patient was shown and demonstrated row machine and tricep extension machine with proper form and technique to work on engaging the core.    OBJECTIVE IMPAIRMENTS: decreased activity tolerance, difficulty walking, decreased balance, decreased endurance, decreased mobility, decreased ROM, decreased strength, impaired flexibility, impaired UE/LE use, postural dysfunction, and pain.  ACTIVITY LIMITATIONS: bending, lifting, carry, locomotion, cleaning, community activity, driving, and or occupation  PERSONAL FACTORS: Hormone  replacement therapy, Hypothyroidism, Insomnia, Sleep apnea are also affecting patient's functional outcome.  REHAB POTENTIAL: Good  CLINICAL DECISION MAKING: Stable/uncomplicated  EVALUATION COMPLEXITY: Low    GOALS: Short term PT Goals Target date: 12/10/2022  Pt will be I and compliant with HEP. Baseline:  Goal status: New Pt will decrease pain by 25% overall Baseline: Goal status: New  Long term PT goals Target date: 01/21/2023 Pt will improve ROM without reports of sitffness to improve functional mobility Baseline: Goal status: New Pt will improve  hip strength to at least 5-/5 MMT to improve functional strength Baseline: Goal status: New Pt will improve FOTO to at least 68% functional to show improved function Baseline: Goal status: New Pt will reduce pain by overall 50% overall with usual activity Baseline: Goal status: New Pt will return to golf with 2-3/ 10 pain and stiffness. Baseline: Goal status: New  PLAN: PT FREQUENCY: 1-2 times per week   PT DURATION: 6-8 weeks  PLANNED INTERVENTIONS (unless contraindicated): aquatic PT, Canalith repositioning, cryotherapy, Electrical stimulation, Iontophoresis with 4 mg/ml dexamethasome, Moist heat, traction, Ultrasound, gait training, Therapeutic exercise, balance training, neuromuscular re-education, patient/family education, prosthetic training, manual techniques, passive ROM, dry needling, taping, vasopnuematic device, vestibular, spinal manipulations, joint manipulations  PLAN FOR NEXT SESSION: Consider TPDN, throacic and lumbar mobility. Core strengthening.     Dessie Coma, PT 12/17/2022, 11:40 AM

## 2022-12-24 ENCOUNTER — Encounter (HOSPITAL_BASED_OUTPATIENT_CLINIC_OR_DEPARTMENT_OTHER): Payer: Self-pay | Admitting: Physical Therapy

## 2022-12-24 ENCOUNTER — Ambulatory Visit (HOSPITAL_BASED_OUTPATIENT_CLINIC_OR_DEPARTMENT_OTHER): Payer: PPO | Admitting: Physical Therapy

## 2022-12-24 DIAGNOSIS — M546 Pain in thoracic spine: Secondary | ICD-10-CM

## 2022-12-24 DIAGNOSIS — M5459 Other low back pain: Secondary | ICD-10-CM

## 2022-12-24 DIAGNOSIS — M6281 Muscle weakness (generalized): Secondary | ICD-10-CM

## 2022-12-24 NOTE — Therapy (Addendum)
 OUTPATIENT PHYSICAL THERAPY THORACOLUMBAR Treatment    Patient Name: Wendy Elliott MRN: [id number] DOB:27-Jan-1951, 72 y.o., female Today's Date: 12/24/2022  END OF SESSION:  PT End of Session - 12/24/22 0937     Visit Number 6    Number of Visits 8    Date for PT Re-Evaluation 01/21/23    Authorization Type [employer]    PT Start Time 0933    PT Stop Time 1012    PT Time Calculation (min) 39 min    Activity Tolerance Patient tolerated treatment well    Behavior During Therapy WFL for tasks assessed/performed                Past Medical History:  Diagnosis Date   Hormone replacement therapy    Hypothyroidism    Insomnia    Sleep apnea    History reviewed. No pertinent surgical history. Patient Active Problem List   Diagnosis Date Noted   Depression, recurrent (HCC) 03/22/2021    PCP: Zilphia Hilt, Charyl Coppersmith, MD  REFERRING PROVIDER: Mort Ards, MD  REFERRING DIAG: Vertebrogenic low back pain [M54.51]   Rationale for Evaluation and Treatment: Rehabilitation  THERAPY DIAG:  No diagnosis found.  ONSET DATE: Ongoing with recent onset 4 months ago.   SUBJECTIVE:                                                                                                                                                                                           SUBJECTIVE STATEMENT: Patient's lower back is doing better. She is having pain in her upper back. She notices it with activity.  Eval Pt reports acute on chronic back pain. She reports a history of trauma; falling off a ladder, falling off a horse and having a cart fall on top of her in the garage. Most recent was 2 years ago. Pt has received acupuncture in the past, but is interested in dry needling.   PERTINENT HISTORY:  Hormone replacement therapy, Hypothyroidism, Insomnia, Sleep apnea  PAIN:  Are you having pain? Yes: NPRS scale: 3/10 Pain location: Thoracic spine .  Pain description:  Tight.  Aggravating factors: Lifting, carrying, painting, pushing, pulling. Rolling out of bed.   Relieving factors: Rest.   PRECAUTIONS: None  WEIGHT BEARING RESTRICTIONS: No  FALLS:  Has patient fallen in last 6 months? 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   PLOF: Independent  PATIENT GOALS: Pt would like to minimize pain.  OBJECTIVE:   DIAGNOSTIC FINDINGS:  None recent.   PATIENT SURVEYS:  FOTO 67.04%, 68% predicted in 10 visits   SCREENING FOR RED FLAGS: Bowel or bladder incontinence: No  COGNITION: Overall cognitive status: Within functional limits for tasks assessed     SENSATION: WFL  MUSCLE LENGTH: Hamstrings: Right 90% deg; Left 90%  deg   POSTURE: No Significant postural limitations  PALPATION:  Significant tension in thoracic and lumbar paraspinals.   LUMBAR ROM:   FULL ROM in lumbar with pt reporting tension throughout.   (Blank rows = not tested)  LOWER EXTREMITY ROM:     FULL ROM throughout bilateral LE  (Blank rows = not tested)  LOWER EXTREMITY MMT:    MMT Right eval Left eval  Hip flexion 4+ 4+  Hip abduction    Knee flexion 5 5  Knee extension 5 5   (Blank rows = not tested)  GAIT: Distance walked: 8ft  Assistive device utilized: None Level of assistance: Complete Independence Comments: Normal   TODAY'S TREATMENT:                                                                                                                              DATE:  12/24/2022   Trigger Point Dry-Needling  Treatment instructions: Expect mild to moderate muscle soreness. S/S of pneumothorax if dry needled over a lung field, and to seek immediate medical attention should they occur. Patient verbalized understanding of these instructions and  education.  Patient Consent Given: Yes Education handout provided: Yes Muscles treated: Bilateral mid thoracic paraspinals T6-T7 2 x 2 0.30 X50 needle 1 spot in bilateral upper traps using a 0.30 X50 needle Electrical stimulation performed: No Parameters: N/A Treatment response/outcome: great tiwtch with all needles   Manual: skilled palpation of trigger points; trigger point release to thoracic and lower back paraspinals.  Trigger point release to upper traps   4/23  Trigger Point Dry-Needling  Treatment instructions: Expect mild to moderate muscle soreness. S/S of pneumothorax if dry needled over a lung field, and to seek immediate medical attention should they occur. Patient verbalized understanding of these instructions and education.  Patient Consent Given: Yes Education handout provided: Yes Muscles treated: left and right L5 paraspinals; bilateral gluteals; bilateral thoracic paraspinals mid;  Electrical stimulation performed: No Parameters: N/A Treatment response/outcome: great tiwtch with all needles   Manual: skilled palpation of trigger points; trigger point release to thoracic and lower back paraspinals.   422 steps on the nu step GR baseline 102 after 106 Triceps extension machin 40lbs 2x10 Row machine 30 lbs   Education provided on transverse abdominal activation. Patient shown picutres of transverse abdomianl muslces and educated on how to breath to maximize contraction.   4/17 Trigger Point Dry-Needling  Treatment instructions: Expect mild to moderate muscle soreness. S/S of pneumothorax if dry needled over a lung field, and to seek immediate medical attention should they occur. Patient verbalized understanding of these instructions and  education.  Patient Consent Given: Yes Education handout provided: Yes Muscles treated: left and right L5 paraspinals; bilateral gluteals; bilateral thoracic paraspinals mid;  Electrical stimulation performed: No Parameters:  N/A Treatment response/outcome: great tiwtch with all needles   Manual: skilled palpation of trigger points; trigger point release to thoracic and lower back paraspinals.   Rows red band 2x10 Shoulder Extension red band 2x10  Palloff press green band x10 each side     4/9 Nustep L4  Trigger Point Dry-Needling  Treatment instructions: Expect mild to moderate muscle soreness. S/S of pneumothorax if dry needled over a lung field, and to seek immediate medical attention should they occur. Patient verbalized understanding of these instructions and education.  Patient Consent Given: Yes Education handout provided: Yes Muscles treated: left and right L5 paraspinals; bilateral gluteals; bilateral thoracic paraspinals mid;  Electrical stimulation performed: No Parameters: N/A Treatment response/outcome: great tiwtch with all needles   Manual: skilled palpation of trigger points; trigger point release to thoracic and lower back paraspinals.   LTR 2x10 Piriformis stretch 3x30 sec each  Sink stretch 2x20 sec hold Tennis ball trigger point release  Bridge 2x10 SLR 2x10 bilateral   Reviewed HEP and how to use it     PATIENT EDUCATION:  Education details: Educated pt on anatomy and physiology of current symptoms, FOTO, diagnosis, prognosis, HEP,  and POC. Person educated: Patient Education method: Medical illustrator Education comprehension: verbalized understanding and returned demonstration  HOME EXERCISE PROGRAM: Access Code: [email or web address] URL: [email or web address] Date: 11/26/2022 Prepared by: Fredia Janus  Exercises - Sidelying Thoracic Rotation with Open Book  - 2 x daily - 7 x weekly - 2 sets - 10 reps - Cat Cow  - 2 x daily - 7 x weekly - 2 sets - 10 reps   ASSESSMENT:  CLINICAL IMPRESSION: The patient has very had very little pain in her lower back as of recently.  She continues to have pain in her mid thoracic area and upper trap area.   She has trigger points in her parascapular area and into her upper traps.  Therapy performed trigger point dry needling to upper traps and mid thoracic area today.  She reported minor soreness following exercises.  She was giving a seated scapular series to work on at home.  Will continue to work patient back in the gym or home program depending on what she thinks is the best for her to do at this time.  Will perform reassessment next visit.  Therapy looked at [facility] today.  She has reached her Foto goal.  OBJECTIVE IMPAIRMENTS: decreased activity tolerance, difficulty walking, decreased balance, decreased endurance, decreased mobility, decreased ROM, decreased strength, impaired flexibility, impaired UE/LE use, postural dysfunction, and pain.  ACTIVITY LIMITATIONS: bending, lifting, carry, locomotion, cleaning, community activity, driving, and or occupation  PERSONAL FACTORS: Hormone replacement therapy, Hypothyroidism, Insomnia, Sleep apnea are also affecting patient's functional outcome.  REHAB POTENTIAL: Good  CLINICAL DECISION MAKING: Stable/uncomplicated  EVALUATION COMPLEXITY: Low    GOALS: Short term PT Goals Target date: 12/10/2022  Pt will be I and compliant with HEP. Baseline:  Goal status: New Pt will decrease pain by 25% overall Baseline: Goal status: New  Long term PT goals Target date: 01/21/2023 Pt will improve ROM without reports of sitffness to improve functional mobility Baseline: Goal status: New Pt will improve  hip strength to at least 5-/5 MMT to improve functional strength Baseline: Goal status: New Pt will improve FOTO to at least 68% functional to show  improved function Baseline: Goal status: New Pt will reduce pain by overall 50% overall with usual activity Baseline: Goal status: New Pt will return to golf with 2-3/ 10 pain and stiffness. Baseline: Goal status: New  PLAN: PT FREQUENCY: 1-2 times per week   PT DURATION: 6-8 weeks  PLANNED  INTERVENTIONS (unless contraindicated): aquatic PT, Canalith repositioning, cryotherapy, Electrical stimulation, Iontophoresis with 4 mg/ml dexamethasome, Moist heat, traction, Ultrasound, gait training, Therapeutic exercise, balance training, neuromuscular re-education, patient/family education, prosthetic training, manual techniques, passive ROM, dry needling, taping, vasopnuematic device, vestibular, spinal manipulations, joint manipulations  PLAN FOR NEXT SESSION: Consider TPDN, throacic and lumbar mobility. Core strengthening.   PHYSICAL THERAPY DISCHARGE SUMMARY  Visits from Start of Care: 6  Current functional level related to goals / functional outcomes: Unknown patient did not return    Remaining deficits: Unknown patient did not return    Education / Equipment: Unknown patient did not return    Patient agrees to discharge. Patient goals were met. Patient is being discharged due to not returning since the last visit.    Kitty Perkins, PT 12/24/2022, 10:06 AM

## 2022-12-31 ENCOUNTER — Ambulatory Visit (HOSPITAL_BASED_OUTPATIENT_CLINIC_OR_DEPARTMENT_OTHER): Payer: PPO | Admitting: Physical Therapy

## 2023-07-11 ENCOUNTER — Telehealth: Payer: Self-pay | Admitting: Neurology

## 2023-07-11 NOTE — Telephone Encounter (Signed)
Pt calling in regards to referral for Sleep. Pt said referral was sent by Dr. Kizzie Ide. Would like a call back.

## 2023-08-08 ENCOUNTER — Encounter: Payer: Self-pay | Admitting: Internal Medicine

## 2023-08-08 ENCOUNTER — Other Ambulatory Visit: Payer: Self-pay

## 2023-08-08 ENCOUNTER — Ambulatory Visit: Payer: PPO | Admitting: Internal Medicine

## 2023-08-08 VITALS — BP 122/70 | HR 80 | Temp 99.1°F | Ht 60.28 in | Wt 150.4 lb

## 2023-08-08 DIAGNOSIS — K117 Disturbances of salivary secretion: Secondary | ICD-10-CM

## 2023-08-08 DIAGNOSIS — J3089 Other allergic rhinitis: Secondary | ICD-10-CM

## 2023-08-08 DIAGNOSIS — M25511 Pain in right shoulder: Secondary | ICD-10-CM | POA: Diagnosis not present

## 2023-08-08 DIAGNOSIS — J393 Upper respiratory tract hypersensitivity reaction, site unspecified: Secondary | ICD-10-CM | POA: Diagnosis not present

## 2023-08-08 DIAGNOSIS — K146 Glossodynia: Secondary | ICD-10-CM | POA: Diagnosis not present

## 2023-08-08 NOTE — Patient Instructions (Addendum)
Other Allergic Rhinitis: Dry Mouth, Burning Mouth - Use nasal saline rinses before nose sprays such as with Neilmed Sinus Rinse.  Use distilled water.   - Hold all anti-histamines (Xyzal, Allegra, Zyrtec, Claritin, Benadryl) 3 days prior to next visit.  - Hold Atrovent nose spray to prevent further drying. Also avoid any anti histamines as that can worsen dryness.    Follow up: 12/20 on 9:30 AM for skin testing

## 2023-08-08 NOTE — Progress Notes (Signed)
NEW PATIENT  Date of Service/Encounter:  08/08/23  Consult requested by: Philip Aspen, Limmie Patricia, MD   Subjective:   Wendy Elliott (DOB: 02-09-1951) is a 72 y.o. female who presents to the clinic on 08/08/2023 with a chief complaint of allergies.  .    History obtained from: chart review and patient.   Dry Mouth/Burning:  Reports for the past several months, she has developed dry mouth and burning.  Has noted thick saliva also.  It is worse at nighttime.  Has even had increased in cavities and gotten oral thrush due to this. Wondering if this is some sort of allergic reaction to a food.   Has had lip filler 1-2 years ago.   Has tried changing toothpaste without much improvement.  Also some concern for anxiety/depression.   Wears a retainer  Seen ENT who discussed trying Xylitol, increased water intake and vitamin intake.   Seen Derm, concern for lichen planus.  Has not started cellcept that was recommended.   Sometimes has dry eyes but only if she doesn't sleep well. Undergone workup for Sjogrens and reports it was negative.   Rhinitis:  Started since she was young  Symptoms include: nasal congestion, rhinorrhea, and post nasal drainage  Occurs seasonally-Spring Potential triggers: not sure   Treatments tried:  Atrovent nose spray PRN  Previous allergy testing: no History of sinus surgery: no Nonallergic triggers: none    Reviewed:  07/01/2023: seen by [facility] for xerostomia, oral lichen planus, hypothyroidism and referred for Allergy testing.    06/26/2023: seen by Derm Dr Reche Dixon for burning lips/dry mouth/sore tongue.  Started previously on empiric TAC ointment and cevimeline but with persistent symptoms.  Rash was subtle white reticulation.  Concern for lichen planus.  Biopsy obtained. Started on Cellcept, Clotrimazole troche, Diflucan and TAC ointment BID PRN.   Lip biopsies: spongiosis, mild acanthosis  01/17/2023: seen by ENT Dr Ernestene Kiel for  xerostomia, burning mouth syndrome. Discussed xylitol, increased water intake, increased vitamin intake.   Past Medical History: Past Medical History:  Diagnosis Date   Hormone replacement therapy    Hypothyroidism    Insomnia    Sleep apnea     Past Surgical History: History reviewed. No pertinent surgical history.  Family History: Family History  Problem Relation Age of Onset   Osteoporosis Mother    Diabetes Father    Dementia Sister    Medication List:  Allergies as of 08/08/2023   No Known Allergies      Medication List        Accurate as of August 08, 2023 10:05 AM. If you have any questions, ask your nurse or doctor.          STOP taking these medications    ipratropium 0.06 % nasal spray Commonly known as: ATROVENT Stopped by: Birder Robson       TAKE these medications    ALPRAZolam 0.25 MG tablet Commonly known as: XANAX Take 1 tablet (0.25 mg total) by mouth 2 (two) times daily as needed for anxiety.   Armour Thyroid 30 MG tablet Generic drug: thyroid Take 30 mg by mouth daily before breakfast.   ascorbic acid 1000 MG tablet Commonly known as: VITAMIN C Take 1,000 mg by mouth daily.   buPROPion 300 MG 24 hr tablet Commonly known as: WELLBUTRIN XL TAKE 1 TABLET BY MOUTH EVERY DAY   Ciclopirox 1 % shampoo Apply topically 2 (two) times daily.   clobetasol 0.05 % external solution Commonly  known as: TEMOVATE Apply 1 Application topically 2 (two) times daily.   cyanocobalamin 1000 MCG tablet Commonly known as: VITAMIN B12 Take 1,000 mcg by mouth daily.   cyclobenzaprine 5 MG tablet Commonly known as: FLEXERIL Take 1 tablet (5 mg total) by mouth at bedtime.   ESTRACE VA Place vaginally.   fluconazole 150 MG tablet Commonly known as: DIFLUCAN Take 150 mg by mouth once.   latanoprost 0.005 % ophthalmic solution Commonly known as: XALATAN Administer 1 drop into both eyes at bedtime.   ondansetron 4 MG tablet Commonly known  as: ZOFRAN Take by mouth.   Oyster Shell Calcium 500 MG Tabs Take by mouth.   PROBIOTIC PO Take by mouth.   Progesterone 40 % Crea by Does not apply route.   Testosterone 20 % Crea by Does not apply route.   Travoprost (BAK Free) 0.004 % Soln ophthalmic solution Commonly known as: TRAVATAN Travatan Z 0.004 % eye drops  PLACE 1 DROP INTO RIGHT EYE QHS   triamcinolone cream 0.1 % Commonly known as: KENALOG APPLY ON THE SKIN TWICE A DAY APPLY TO EARS AS NEEDED ITCHING   valACYclovir 1000 MG tablet Commonly known as: VALTREX Take by mouth daily. As needed   Vitamin D3 50 MCG (2000 UT) capsule Take 2,000 Units by mouth daily.         REVIEW OF SYSTEMS: Pertinent positives and negatives discussed in HPI.   Objective:   Physical Exam: BP 122/70 (BP Location: Left Arm, Cuff Size: Normal)   Pulse 80   Temp 99.1 F (37.3 C) (Temporal)   Ht 5' 0.28" (1.531 m)   Wt 150 lb 6.4 oz (68.2 kg)   SpO2 97%   BMI 29.10 kg/m  Body mass index is 29.1 kg/m. GEN: alert, well developed HEENT: clear conjunctiva, nose with + mild inferior turbinate hypertrophy, pink nasal mucosa,no rhinorrhea, no cobblestoning HEART: regular rate LUNGS: no coughing, unlabored respiration ABDOMEN: soft, non distended  SKIN: no rashes around mouth/lips  Assessment:   1. Other allergic rhinitis   2. Upper respiratory tract hypersensitivity reaction   3. Acute pain of right shoulder   4. Burning mouth syndrome   5. Xerostomia     Plan/Recommendations:  Other Allergic Rhinitis: - Due to turbinate hypertrophy, seasonal symptoms and unresponsive to over the counter meds, will perform skin testing to identify aeroallergen triggers.   - Use nasal saline rinses before nose sprays such as with Neilmed Sinus Rinse.  Use distilled water.   - Hold all anti-histamines (Xyzal, Allegra, Zyrtec, Claritin, Benadryl) 3 days prior to next visit.  - Hold Atrovent nose spray to prevent further drying. Also  avoid any anti histamines as that can worsen dryness.   Dry Mouth/Burning Mouth - Negative SSA/SSB previously - Seen by ENT/Derm; concern for burning mouth syndrome/oral lichen planus.   - Can consider patch testing due to use of retainers.  Would need testing for acrylate resins which we do not have.   R Shoulder Pain Of note, she also brought up a recent shoulder injury resulting in limitation of range of motion and persistent pain, 3-4 weeks out; discussed considering seeing Ortho for further evaluation.  Follow up: 12/20 on 9:30 AM for skin testing 1-68  Alesia Morin, MD [facility]

## 2023-08-15 ENCOUNTER — Ambulatory Visit (INDEPENDENT_AMBULATORY_CARE_PROVIDER_SITE_OTHER): Payer: PPO | Admitting: Internal Medicine

## 2023-08-15 DIAGNOSIS — J393 Upper respiratory tract hypersensitivity reaction, site unspecified: Secondary | ICD-10-CM

## 2023-08-15 DIAGNOSIS — J343 Hypertrophy of nasal turbinates: Secondary | ICD-10-CM

## 2023-08-15 DIAGNOSIS — J3089 Other allergic rhinitis: Secondary | ICD-10-CM

## 2023-08-15 NOTE — Patient Instructions (Addendum)
Chronic Rhinitis: - SPT 07/2023: positive to none.   - Use nasal saline rinses before nose sprays such as with Neilmed Sinus Rinse.  Use distilled water.  - If symptoms are uncontrolled, can use Flonase 1-2 sprays each nostril daily as needed.  Aim upward and outward.  - Avoid anti histamines to prevent drying.   - Avoid Atrovent nose spray to prevent further drying.   Dry Mouth/Burning Mouth - Negative SSA/SSB previously - SPT 07/2023: negative to commonly allergenic foods.  - Seen by ENT/Derm; concern for burning mouth syndrome/oral lichen planus.   - Can consider patch testing due to use of retainers.  Would need testing for acrylate resins which we do not have. This is may be available at academic Dermatology centers.

## 2023-08-15 NOTE — Progress Notes (Signed)
FOLLOW UP Date of Service/Encounter:  08/15/23   Subjective:  Wendy Elliott (DOB: 12/11/1950) is a 72 y.o. female who [facility] for follow up for skin testing.   History obtained from: chart review and patient.  Anti histamines held.   Past Medical History: Past Medical History:  Diagnosis Date   Hormone replacement therapy    Hypothyroidism    Insomnia    Sleep apnea     Objective:  There were no vitals taken for this visit. There is no height or weight on file to calculate BMI. Physical Exam: GEN: alert, well developed HEENT: clear conjunctiva, MMM LUNGS: unlabored respiration  Skin Testing:  Skin prick testing was placed, which includes aeroallergens/foods, histamine control, and saline control.  Verbal consent was obtained prior to placing test.  Patient tolerated procedure well.  Allergy testing results were read and interpreted by myself, documented by clinical staff. Adequate positive and negative control.  Positive results to:  Results discussed with patient/family.  Airborne Adult Perc - 08/15/23 0900     Time Antigen Placed 9528    Allergen Manufacturer Waynette Buttery    Location Back    Number of Test 55    1. Control-Buffer 50% Glycerol Negative    2. Control-Histamine 3+    3. Bahia Negative    4. French Southern Territories Negative    5. Johnson Negative    6. Kentucky Blue Negative    7. Meadow Fescue Negative    8. Perennial Rye Negative    9. Timothy Negative    10. Ragweed Mix Negative    11. Cocklebur Negative    12. Plantain,  English Negative    13. Baccharis Negative    14. Dog Fennel Negative    15. Russian Thistle Negative    16. Lamb's Quarters Negative    17. Sheep Sorrell Negative    18. Rough Pigweed Negative    19. Marsh Elder, Rough Negative    20. Mugwort, Common Negative    21. Box, Elder Negative    22. Cedar, red Negative    23. Sweet Gum Negative    24. Pecan Pollen Negative    25. Pine  Mix Negative    26. Walnut, Black Pollen Negative    27. Red Mulberry Negative    28. Ash Mix Negative    29. Birch Mix Negative    30. Beech American Negative    31. Cottonwood, Guinea-Bissau Negative    32. Hickory, White Negative    33. Maple Mix Negative    34. Oak, Guinea-Bissau Mix Negative    35. Sycamore Eastern Negative    36. Alternaria Alternata Negative    37. Cladosporium Herbarum Negative    38. Aspergillus Mix Negative    39. Penicillium Mix Negative    40. Bipolaris Sorokiniana (Helminthosporium) Negative    41. Drechslera Spicifera (Curvularia) Negative    42. Mucor Plumbeus Negative    43. Fusarium Moniliforme Negative    44. Aureobasidium Pullulans (pullulara) Negative    45. Rhizopus Oryzae Negative    46. Botrytis Cinera Negative    47. Epicoccum Nigrum Negative    48. Phoma Betae Negative    49. Dust Mite Mix Negative    50. Cat Hair 10,000 BAU/ml Negative    51.  Dog Epithelia Negative    52. Mixed Feathers Negative    53. Horse Epithelia Negative    54. Cockroach, German Negative    55. Tobacco Leaf  Negative             13 Food Perc - 08/15/23 0900       Test Information   Time Antigen Placed 5621    Allergen Manufacturer Waynette Buttery    Location Back    Number of allergen test 13      Food   1. Peanut Negative    2. Soybean Negative    3. Wheat Negative    4. Sesame Negative    5. Milk, Cow Negative    6. Casein Negative    7. Egg White, Chicken Negative    8. Shellfish Mix Negative    9. Fish Mix Negative    10. Cashew Negative    11. Walnut Food Negative    12. Almond Negative    13. Hazelnut Negative              Assessment:   1. Other allergic rhinitis   2. Upper respiratory tract hypersensitivity reaction   3. Nasal turbinate hypertrophy     Plan/Recommendations:  Other Allergic Rhinitis: - Due to turbinate hypertrophy, seasonal symptoms and unresponsive to over the counter meds, will perform skin testing to identify aeroallergen  triggers.   - SPT 07/2023: positive to none.   - Use nasal saline rinses before nose sprays such as with Neilmed Sinus Rinse.  Use distilled water.  - If symptoms are uncontrolled, can use Flonase 1-2 sprays each nostril daily as needed.  Aim upward and outward.  - Avoid anti histamines.  - Hold Atrovent nose spray to prevent further drying. Also avoid any anti histamines as that can worsen dryness.    Dry Mouth/Burning Mouth - Negative SSA/SSB previously - SPT 07/2023: negative to commonly allergenic foods.  Discussed low suspicion for food allergies.  - Seen by ENT/Derm; concern for burning mouth syndrome/oral lichen planus.   - Can consider patch testing due to use of retainers.  Would need testing for acrylate resins which we do not have. This is may be available at academic Dermatology centers.       Return if symptoms worsen or fail to improve.  Alesia Morin, MD [facility]

## 2023-09-30 NOTE — Telephone Encounter (Signed)
 PA Status  Pending  Medication Mycophenolate 250mg  Capsule  Provider Jorizzo  Insurance Company [employer]  Method  Key/Ref# [id number]  [id number]

## 2023-10-02 NOTE — Telephone Encounter (Signed)
 Medication Access Center Summary  PA Status Denied  Medication Mycophenolate 250mg  Capsule  Reason for Denial We denied this request under Medicare Part D because: Drug not prescribed for a medically accepted indication See instructions for additional detail: Part D sponsors are responsible for ensuring that covered Part D are being prescribed for medically-accepted indications (those approved by the Food and Drug Administration (FDA) or those supported by other Medicare-approved references; for a list of Medicare-approved references, please refer to the compendia described in section 1927(G)(1)(B)(I) of the Social Security Act). Additionally, the requested drug must meet the following therapeutic criteria: Diagnosis of Cardiac transplant rejection, Liver transplant rejection, Renal transplant rejection. Your request was denied because it is being used for an indication which is not approved or medically-accepted: Lichen planus, angular cheilitis.   Insurance Company [employer]age  Provider Jorizzo   Denial letter saved to Media tab     Suzen LOISE [facility] [employer]

## 2024-03-16 ENCOUNTER — Telehealth: Payer: Self-pay

## 2024-03-16 NOTE — Telephone Encounter (Signed)
 Spoke with patient, states she saw a different provider a couple months ago, states they keep prescribing medicine but it is not working, would like a second opinion, patient will have records sent to our practice. For review.

## 2024-03-25 ENCOUNTER — Encounter (INDEPENDENT_AMBULATORY_CARE_PROVIDER_SITE_OTHER): Payer: Self-pay | Admitting: Otolaryngology

## 2024-03-25 ENCOUNTER — Ambulatory Visit (INDEPENDENT_AMBULATORY_CARE_PROVIDER_SITE_OTHER): Admitting: Otolaryngology

## 2024-03-25 VITALS — BP 111/76 | HR 100 | Ht 62.0 in | Wt 140.0 lb

## 2024-03-25 DIAGNOSIS — J343 Hypertrophy of nasal turbinates: Secondary | ICD-10-CM | POA: Diagnosis not present

## 2024-03-25 DIAGNOSIS — K117 Disturbances of salivary secretion: Secondary | ICD-10-CM

## 2024-03-25 DIAGNOSIS — R0982 Postnasal drip: Secondary | ICD-10-CM

## 2024-03-25 DIAGNOSIS — R0981 Nasal congestion: Secondary | ICD-10-CM

## 2024-03-25 DIAGNOSIS — J310 Chronic rhinitis: Secondary | ICD-10-CM

## 2024-03-25 DIAGNOSIS — R070 Pain in throat: Secondary | ICD-10-CM

## 2024-03-25 NOTE — Progress Notes (Unsigned)
 Patient ID: Wendy Elliott, female   DOB: 1950/10/29, 73 y.o.   MRN: [id number]  Follow-up: Dry mouth, throat discomfort, chronic nasal drainage, nasal congestion  HPI:  The patient is a 73 year old female who returns today for follow-up evaluation.  She was last seen in September 2024.  At that time, she was complaining of chronic nasal congestion, nasal drainage, and mucus accumulation in her throat.  She was also experiencing frequent dry mouth and dry eyes.  She was treated with Atrovent nasal sprays.  The patient returns today complaining of persistent symptoms.  She was recently evaluated by a gastroenterologist.  She was treated with omeprazole without improvement in her symptoms.  She continues to have significant dry mouth and throat discomfort.  She is still having frequent nasal drainage.  She denies any fever or visual change.  Exam:  General: Communicates without difficulty, well nourished, no acute distress. Head: Normocephalic, no evidence injury, no tenderness, facial buttresses intact without stepoff. Face/sinus: No tenderness to palpation and percussion. Facial movement is normal and symmetric. Eyes: PERRL, EOMI. No scleral icterus, conjunctivae clear. Neuro: CN II exam reveals vision grossly intact.  No nystagmus at any point of gaze. Ears: Auricles well formed without lesions.  Ear canals are intact without mass or lesion.  No erythema or edema is appreciated.  The TMs are intact without fluid. Nose: External evaluation reveals normal support and skin without lesions.  Dorsum is intact.  Anterior rhinoscopy reveals congested mucosa over anterior aspect of inferior turbinates and intact septum.  No purulence noted. Oral:  Oral cavity and oropharynx are intact, symmetric, without erythema or edema.  Mucosa is moist without lesions. Neck: Full range of motion without pain.  There is no significant lymphadenopathy.  No masses palpable.  Thyroid  bed within normal limits to palpation.   Parotid glands and submandibular glands equal bilaterally without mass.  Trachea is midline. Neuro:  CN 2-12 grossly intact.   Procedure:  Flexible Nasal Endoscopy: Description: Risks, benefits, and alternatives of flexible endoscopy were explained to the patient.  Specific mention was made of the risk of throat numbness with difficulty swallowing, possible bleeding from the nose and mouth, and pain from the procedure.  The patient gave oral consent to proceed.  The flexible scope was inserted into the right nasal cavity.  Endoscopy of the interior nasal cavity, superior, inferior, and middle meatus was performed. The sphenoid-ethmoid recess was examined. Edematous mucosa was noted.  No polyp, mass, or lesion was appreciated.  Olfactory cleft was clear.  Nasopharynx was clear.  Turbinates were mildly hypertrophied but without mass.  The procedure was repeated on the contralateral side with similar findings.  The patient tolerated the procedure well.   Assessment: 1.  Chronic rhinitis with nasal mucosal congestion and mild bilateral inferior turbinate hypertrophy. 2.  Chronic postnasal drainage. 3.  Frequent throat discomfort, dry mouth, and dry eyes.   Plan: 1.  The physical exam and nasal endoscopy findings are reviewed with the patient. 2.  Nasal saline irrigation is encouraged. 3.  Atrovent nasal sprays as needed. 4.  The patient may benefit from a rheumatology evaluation. 5.  The patient is encouraged to call with any questions or concerns.

## 2024-03-26 DIAGNOSIS — J310 Chronic rhinitis: Secondary | ICD-10-CM | POA: Insufficient documentation

## 2024-03-26 DIAGNOSIS — K117 Disturbances of salivary secretion: Secondary | ICD-10-CM | POA: Insufficient documentation

## 2024-03-26 DIAGNOSIS — R0982 Postnasal drip: Secondary | ICD-10-CM | POA: Insufficient documentation

## 2024-03-26 DIAGNOSIS — J343 Hypertrophy of nasal turbinates: Secondary | ICD-10-CM | POA: Insufficient documentation

## 2024-03-26 DIAGNOSIS — R070 Pain in throat: Secondary | ICD-10-CM | POA: Insufficient documentation

## 2024-04-12 NOTE — Telephone Encounter (Signed)
 Advised patient that we still have not received records. She was not sure where she went, it was [place].

## 2024-04-14 NOTE — Telephone Encounter (Signed)
 Patient called regarding records. Fax sent over to [facility] to receive records.

## 2024-04-16 NOTE — Telephone Encounter (Signed)
 Good afternoon Dr. Stacia  The following patient has sent records to establish care. They are located in media. Please review and advise of scheduling.. Thank you.

## 2024-04-23 ENCOUNTER — Ambulatory Visit (INDEPENDENT_AMBULATORY_CARE_PROVIDER_SITE_OTHER): Admitting: Gastroenterology

## 2024-04-23 ENCOUNTER — Encounter: Payer: Self-pay | Admitting: Gastroenterology

## 2024-04-23 VITALS — BP 104/64 | HR 99 | Ht 61.0 in | Wt 152.2 lb

## 2024-04-23 DIAGNOSIS — K130 Diseases of lips: Secondary | ICD-10-CM | POA: Diagnosis not present

## 2024-04-23 DIAGNOSIS — K219 Gastro-esophageal reflux disease without esophagitis: Secondary | ICD-10-CM

## 2024-04-23 DIAGNOSIS — R140 Abdominal distension (gaseous): Secondary | ICD-10-CM

## 2024-04-23 DIAGNOSIS — R635 Abnormal weight gain: Secondary | ICD-10-CM | POA: Diagnosis not present

## 2024-04-23 NOTE — Patient Instructions (Signed)
 You have been scheduled for an endoscopy. Please follow written instructions given to you at your visit today.  If you use inhalers (even only as needed), please bring them with you on the day of your procedure.  If you take any of the following medications, they will need to be adjusted prior to your procedure:   DO NOT TAKE 7 DAYS PRIOR TO TEST- Trulicity (dulaglutide) Ozempic, Wegovy (semaglutide) Mounjaro (tirzepatide) Bydureon Bcise (exanatide extended release)  DO NOT TAKE 1 DAY PRIOR TO YOUR TEST Rybelsus (semaglutide) Adlyxin (lixisenatide) Victoza (liraglutide) Byetta (exanatide) ___________________________________________________________________________   _______________________________________________________  If your blood pressure at your visit was 140/90 or greater, please contact your primary care physician to follow up on this.  _______________________________________________________  If you are age 46 or older, your body mass index should be between 23-30. Your Body mass index is 28.77 kg/m. If this is out of the aforementioned range listed, please consider follow up with your Primary Care Provider.  If you are age 66 or younger, your body mass index should be between 19-25. Your Body mass index is 28.77 kg/m. If this is out of the aformentioned range listed, please consider follow up with your Primary Care Provider.   ________________________________________________________  The [facility] providers would like to encourage you to use MYCHART to communicate with providers for non-urgent requests or questions.  Due to long hold times on the telephone, sending your provider a message by [facility] may be a faster and more efficient way to get a response.  Please allow 48 business hours for a response.  Please remember that this is for non-urgent requests.  _______________________________________________________  [facility] is using a team-based approach  to care.  Your team is made up of your doctor and two to three APPS. Our APPS (Nurse Practitioners and Physician Assistants) work with your physician to ensure care continuity for you. They are fully qualified to address your health concerns and develop a treatment plan. They communicate directly with your gastroenterologist to care for you. Seeing the Advanced Practice Practitioners on your physician's team can help you by facilitating care more promptly, often allowing for earlier appointments, access to diagnostic testing, procedures, and other specialty referrals.    It was a pleasure to see you today!  Thank you for trusting me with your gastrointestinal care!    Scott E.Stacia, MD

## 2024-04-23 NOTE — Progress Notes (Signed)
 Discussed the use of AI scribe software for clinical note transcription with the patient, who gave verbal consent to proceed.  HPI :  Wendy Elliott is a 73 year old female who presents with persistent abdominal bloating, weight gain, and atypical reflux symptoms.  She was previously followed by [facility] but requested to switch to [facility] for a second opinion.  She has experienced persistent abdominal bloating and gas distention for nearly a year, beginning around December of the previous year. During this time, she has gained approximately 15 to 20 pounds, which she attributes to her symptoms and a decrease in physical activity due to not feeling well. The bloating is constant and has not improved over time. No significant abdominal pain, constipation, or diarrhea.  She describes atypical reflux symptoms, including waking up at night with a dry mouth and the need to rinse her mouth multiple times. Dry mouth occurs when lying down, and she has noticed dental erosion, which her dentist suggested might be due to acid reflux. Despite trying multiple acid-reducing medications, there has been no improvement in her symptoms. No typical GERD symptoms such as heartburn and frequent acid regurgitation, but she reports a sensation of drainage in the back of her throat, sometimes described as brownish mucus.  An upper endoscopy in May revealed a Schatzki ring that was dilated. She reports that an ENT evaluation did not reveal any cause for her symptoms.  She has been experiencing dental issues, including enamel erosion, which she attributes to her reflux symptoms. She has been diagnosed with oral lichen planus and cheilitistaking mycophenolate for oral irritation, prescribed by a dermatologist, but has not found it helpful. No significant dietary changes and continues to consume foods she has always eaten, including tomatoes.  She wakes up multiple times at night due to dry mouth or a  sensation of 'sweaters on her teeth,' leading her to use mouthwash or brush her teeth. She has tried various treatments for postnasal drip without success.  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 She is retired but remains active in managing her properties.      Review of records from [facility]: November 2024.  Bloating, recommended IBgard  February 2025, treated empirically with ciprofloxacin for possible SIBO.  Seen in March and prescribed pantoprazole and famotidine.  March 2025.  Omeprazole not helping with acid reflux and bloating.  Prescribed pantoprazole and Pepcid.  June 2025.  Describes symptoms at that time of dryness of mouth and a soreness of her tongue with acid.  Seen by dermatology in November 2024 for oral lichen planus.  Using Biotene.Reports being tested for Sjogren syndrome and was negative. Complains of abdominal bloating.  Note indicates she was treated for SIBO in February 2025 which did not help bloating. Recommended to try low FODMAP diet.  Abdominal x-ray June 11, 2023 showed nonobstructive gas pattern, mild colonic stool burden and mild distention of gastric lumen.  TTG IgA tested in October 2024, normal. H. pylori stool antigen - November 2024   EGD Jan 06, 2024 (Dr. Saintclair) Schatzki ring with low-grade narrowing, dilated to 20 mm with moderate mucosal disruption Esophagus biopsied Patchy mild erythema in the antrum and body 2 cm hiatal hernia Medium size diverticulum in the third portion of the duodenum  Biopsies showed reactive changes, negative for H. pylori.  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  Esophageal biopsy showed reactive squamous mucosa, negative for eosinophilic esophagitis.  Colonoscopy  November 09, 2020 (Dr. Donnald) Sigmoid diverticulosis Ileal polypoid lesion, biopsied  Pathology Polypoid small bowel mucosa with benign lymphoid aggregate, negative  for active ileitis  Colonoscopy November 2016 2 diminutive adenomas  Past Medical History:  Diagnosis Date   Hormone replacement therapy    Hypothyroidism    Insomnia    Sleep apnea      No past surgical history on file. Family History  Problem Relation Age of Onset   Osteoporosis Mother    Diabetes Father    Dementia Sister    Social History   Tobacco Use   Smoking status: Former   Smokeless tobacco: Never   Tobacco comments:    Quit about 2000  Vaping Use   Vaping status: Never Used  Substance Use Topics   Alcohol use: Yes    Alcohol/week: 2.0 standard drinks of alcohol    Types: 2 Glasses of wine per week   Drug use: No   Current Outpatient Medications  Medication Sig Dispense Refill   ALPRAZolam  (XANAX ) 0.25 MG tablet Take 1 tablet (0.25 mg total) by mouth 2 (two) times daily as needed for anxiety. 30 tablet 0   ascorbic acid (VITAMIN C) 1000 MG tablet Take 1,000 mg by mouth daily.     buPROPion  (WELLBUTRIN  XL) 300 MG 24 hr tablet TAKE 1 TABLET BY MOUTH EVERY DAY (Patient not taking: Reported on 03/25/2024) 90 tablet 1   Cholecalciferol (VITAMIN D3) 50 MCG (2000 UT) capsule Take 2,000 Units by mouth daily.     Ciclopirox 1 % shampoo Apply topically 2 (two) times daily.     clobetasol (TEMOVATE) 0.05 % external solution Apply 1 Application topically 2 (two) times daily.     cyanocobalamin (VITAMIN B12) 1000 MCG tablet Take 1,000 mcg by mouth daily.     cyclobenzaprine  (FLEXERIL ) 5 MG tablet Take 1 tablet (5 mg total) by mouth at bedtime. 10 tablet 0   Estradiol  (ESTRACE  VA) Place vaginally.     fluconazole (DIFLUCAN) 150 MG tablet Take 150 mg by mouth once.     latanoprost (XALATAN) 0.005 % ophthalmic solution Administer 1 drop into both eyes at bedtime.     ondansetron (ZOFRAN) 4 MG tablet Take by mouth. (Patient not taking: Reported on 03/25/2024)     Oyster Shell Calcium 500 MG TABS Take by mouth.     Probiotic Product (PROBIOTIC PO) Take by mouth.      Progesterone  40 % CREA by Does not apply route.     Testosterone  20 % CREA by Does not apply route.     thyroid  (ARMOUR THYROID ) 30 MG tablet Take 30 mg by mouth daily before breakfast.     Travoprost, BAK Free, (TRAVATAN) 0.004 % SOLN ophthalmic solution Travatan Z 0.004 % eye drops  PLACE 1 DROP INTO RIGHT EYE QHS (Patient not taking: Reported on 03/25/2024)     triamcinolone cream (KENALOG) 0.1 % APPLY ON THE SKIN TWICE A DAY APPLY TO EARS AS NEEDED ITCHING (Patient not taking: Reported on 03/25/2024)     valACYclovir (VALTREX) 1000 MG tablet Take by mouth daily. As needed (Patient not taking: Reported on 03/25/2024)     No current facility-administered medications for this visit.   No Known Allergies   Review of Systems: All systems reviewed and negative except where noted in HPI.    No results found.  Physical Exam: BP 104/64   Pulse 99   Ht 5' 1 (1.549 m)   Wt 152 lb 4 oz (69.1 kg)  SpO2 98%   BMI 28.77 kg/m  Constitutional: Pleasant,well-developed, Caucasian female in no acute distress. HEENT: Normocephalic and atraumatic. Conjunctivae are normal. No scleral icterus.  Angular cheilitis noted.  Focal dark pigmented discoloration of tongue. No oral ulcers/aphthi Neck supple.  Cardiovascular: Normal rate, regular rhythm.  Pulmonary/chest: Effort normal and breath sounds normal. No wheezing, rales or rhonchi. Abdominal: Soft, nondistended, nontender. Bowel sounds active throughout. There are no masses palpable. No hepatomegaly. Extremities: no edema Neurological: Alert and oriented to person place and time. Skin: Skin is warm and dry. No rashes noted. Psychiatric: Normal mood and affect. Behavior is normal.  CBC No results found for: WBC, RBC, HGB, HCT, PLT, MCV, MCH, MCHC, RDW, LYMPHSABS, MONOABS, EOSABS, BASOSABS  CMP     Component Value Date/Time   NA 139 10/02/2020 1355   K 4.3 10/02/2020 1355   CL 102 10/02/2020 1355   CO2 28 10/02/2020  1355   GLUCOSE 93 10/02/2020 1355   BUN 22 10/02/2020 1355   CREATININE 0.63 10/02/2020 1355   CALCIUM 10.3 10/02/2020 1355   PROT 7.1 10/02/2020 1355   ALBUMIN 4.4 10/02/2020 1355   AST 17 10/02/2020 1355   ALT 16 10/02/2020 1355   ALKPHOS 37 (L) 10/02/2020 1355   BILITOT 0.4 10/02/2020 1355        No data to display            ASSESSMENT AND PLAN:   73 year old female with 1 year history of chronic abdominal bloating and distention, as well as atypical GERD symptoms consisting of dry mouth, mouth irritation, dental erosion and throat irritation, no response to acid suppression.  Schatzki ring noted on EGD, but no overt reflux esophagitis; biopsies with reactive changes  Atypical gastroesophageal reflux symptoms with dental enamel erosion and dry mouth Atypical GERD symptoms with dental erosion and dry mouth. Previous endoscopy showed Schatzki ring and reactive esophageal changes. No improvement with acid-reducing medications. GERD less likely; consider non-acid reflux or other causes. - Schedule Bravo probe study to evaluate for GERD.  Chronic abdominal bloating and distention Persistent bloating and distention for a year without pain or bowel irregularities. No response to IBGard, empiric SIBO treatment or low FODMAP diet.  Likely functional. - Consider TCA, diaphragmatic breathing exercises  Chronic oral mucosal irritation, cheiltis and tongue discoloration Chronic oral irritation and tongue discoloration. No significant improvement with mycophenolate. Discoloration not typical of reflux-related changes. - Question underlying autoimmune disease, but serologies negative  Abnormal weight gain Weight gain of 15-20 pounds over the past year. No significant dietary changes reported but patient reports decreased exercise due to feeling poorly overall.  Recording duration: 23 minutes     Eliyanna Ault E. Stacia, MD [facility]    Jhon, Elveria LABOR, MD

## 2024-04-30 ENCOUNTER — Other Ambulatory Visit (INDEPENDENT_AMBULATORY_CARE_PROVIDER_SITE_OTHER): Payer: Self-pay | Admitting: Otolaryngology

## 2024-04-30 ENCOUNTER — Telehealth (INDEPENDENT_AMBULATORY_CARE_PROVIDER_SITE_OTHER): Payer: Self-pay

## 2024-04-30 DIAGNOSIS — K117 Disturbances of salivary secretion: Secondary | ICD-10-CM

## 2024-05-04 NOTE — Telephone Encounter (Signed)
 Done

## 2024-05-10 NOTE — Progress Notes (Unsigned)
 Office Visit Note  Patient: Wendy Elliott             Date of Birth: 1950-12-17           MRN: [id number]             PCP: Jhon Elveria LABOR, MD Referring: Karis Clunes, MD Visit Date: 05/11/2024 Occupation: @GUAROCC @  Subjective:  No chief complaint on file.    History of Present Illness: Wendy Elliott is a 73 y.o. female ***   She admits that her symptoms started ~1.5 years ago; she does admit to prior hx of severe COVID infection.  She denies rashes, no oral ulcers. She admits to severe dry eyes and dry mouth. She denies photosensitivity. She admits to mild raynaud's (admits to it being there her whole life). She denies joint pain.   She admits to one grandmother w/ possible RA. No other known autoimmune diseases in the family.    Activities of Daily Living:  Patient reports morning stiffness for 0 minute.   Patient Denies nocturnal pain.  Difficulty dressing/grooming: Denies Difficulty climbing stairs: Denies Difficulty getting out of chair: Denies Difficulty using hands for taps, buttons, cutlery, and/or writing: Denies  Review of Systems  Constitutional:  Negative for fatigue.  HENT:  Positive for mouth dryness. Negative for mouth sores.   Eyes:  Positive for dryness.  Respiratory:  Negative for shortness of breath.   Cardiovascular:  Negative for chest pain and palpitations.  Gastrointestinal:  Negative for blood in stool, constipation and diarrhea.  Endocrine: Negative for increased urination.  Genitourinary:  Negative for involuntary urination.  Musculoskeletal:  Negative for joint pain, gait problem, joint pain, joint swelling, myalgias, muscle weakness, morning stiffness, muscle tenderness and myalgias.  Skin:  Negative for color change, rash, hair loss and sensitivity to sunlight.  Allergic/Immunologic: Negative for susceptible to infections.  Neurological:  Negative for dizziness and headaches.  Hematological:  Negative for swollen glands.   Psychiatric/Behavioral:  Negative for depressed mood and sleep disturbance. The patient is not nervous/anxious.      Rheum History: # Diagnosed in ***.  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 Social History   Social History Narrative   Rarely drinks caffeine    Immunization History  Administered Date(s) Administered   Tdap 05/26/2020     Objective: Vital Signs: There were no vitals taken for this visit.   Physical Exam Vitals and nursing note reviewed.  HENT:     Head: Normocephalic and atraumatic.     Nose: Nose normal.  Eyes:     Conjunctiva/sclera: Conjunctivae normal.     Pupils: Pupils are equal, round, and reactive to light.  Cardiovascular:     Rate and Rhythm: Normal rate and regular  rhythm.     Heart sounds: Normal heart sounds.  Pulmonary:     Effort: Pulmonary effort is normal.     Breath sounds: Normal breath sounds.  Skin:    General: Skin is warm and dry.  Neurological:     Mental Status: She is alert. Mental status is at baseline.  Psychiatric:        Mood and Affect: Mood normal.        Behavior: Behavior normal.      Musculoskeletal Exam: ***  CDAI Exam: CDAI Score: -- Patient Global: --; Provider Global: -- Swollen: --; Tender: -- Joint Exam 05/11/2024   No joint  exam has been documented for this visit   There is currently no information documented on the homunculus. Go to the Rheumatology activity and complete the homunculus joint exam.  Investigation: No additional findings.  Imaging: No results found.  Recent Labs: Lab Results  Component Value Date   NA 139 10/02/2020   K 4.3 10/02/2020   CL 102 10/02/2020   CO2 28 10/02/2020   GLUCOSE 93 10/02/2020   BUN 22 10/02/2020   CREATININE 0.63 10/02/2020   BILITOT 0.4 10/02/2020   ALKPHOS 37 (L) 10/02/2020   AST 17 10/02/2020   ALT 16 10/02/2020   PROT 7.1 10/02/2020   ALBUMIN 4.4 10/02/2020   CALCIUM 10.3 10/02/2020   No results found for: ANA, RF  Speciality Comments: No specialty comments available.  Procedures:  No procedures performed Allergies: Patient has no known allergies.   Assessment / Plan:     Visit Diagnoses: No diagnosis found.    #High risk medication use  Orders: No orders of the defined types were placed in this encounter.  No orders of the defined types were placed in this encounter.   I personally spent a total of *** minutes in the care of the patient today including {Time Based Coding:210964241}.  Follow-Up Instructions: No follow-ups on file.   Asberry Claw, DO

## 2024-05-11 ENCOUNTER — Ambulatory Visit

## 2024-05-11 VITALS — BP 100/69 | HR 103 | Temp 98.9°F | Resp 14 | Ht 61.25 in | Wt 152.4 lb

## 2024-05-11 DIAGNOSIS — M3500 Sicca syndrome, unspecified: Secondary | ICD-10-CM

## 2024-05-11 DIAGNOSIS — K117 Disturbances of salivary secretion: Secondary | ICD-10-CM

## 2024-05-11 DIAGNOSIS — L408 Other psoriasis: Secondary | ICD-10-CM | POA: Diagnosis not present

## 2024-05-11 NOTE — Patient Instructions (Signed)
Treating Dry Mouth  Dry Mouth (also called xerostomia) is a condition that occurs when your body doesn't produce enough saliva. Saliva is an essential body fluid for protection and preservation of the oral cavity and oral functions.  Dry mouth can cause complications such as difficulty swallowing, severe and progressive tooth decay, and oral infections (particularly fungal).   Below are products and other management strategies that can help relieve the symptoms and reduce complications of dry mouth.  . Keep your mouth moist by sipping small amounts of water during the day. Excessive sips of can reduce the oral mucus film and increase symptoms. . Avoid frequent intake of acidic and caffeinated beverages. . Increase salivary secretion by chewing gum containing no sugar or sucking sugar free hard candies. . Over-the-counter saliva substitutes such as Biotne( Moisturizing Gel, Oral Rinses and Mouth Spray), Oramoist (patches), OraCoat (melts and lozenges).  Biotne also makes non-irritating toothpaste.

## 2024-05-12 LAB — SJOGREN'S SYNDROME ANTIBODS(SSA + SSB)
SSA (Ro) (ENA) Antibody, IgG: <1 AI
SSB (La) (ENA) Antibody, IgG: <1 AI

## 2024-05-16 ENCOUNTER — Ambulatory Visit: Payer: Self-pay

## 2024-05-19 ENCOUNTER — Telehealth: Payer: Self-pay | Admitting: Gastroenterology

## 2024-05-19 NOTE — Telephone Encounter (Signed)
 Reached patient. Verified appointment for her Bravo placement appointment.

## 2024-05-19 NOTE — Telephone Encounter (Signed)
 Patient requesting to speak with a nurse in regards to upcoming apt's. Please advise.   Thank you

## 2024-05-25 ENCOUNTER — Ambulatory Visit: Admitting: Gastroenterology

## 2024-05-25 ENCOUNTER — Telehealth: Payer: Self-pay | Admitting: Gastroenterology

## 2024-05-25 ENCOUNTER — Encounter: Payer: Self-pay | Admitting: Gastroenterology

## 2024-05-25 ENCOUNTER — Encounter

## 2024-05-25 VITALS — BP 129/74 | HR 75 | Temp 97.8°F | Resp 24 | Ht 61.0 in | Wt 152.0 lb

## 2024-05-25 DIAGNOSIS — K219 Gastro-esophageal reflux disease without esophagitis: Secondary | ICD-10-CM | POA: Diagnosis not present

## 2024-05-25 MED ORDER — SODIUM CHLORIDE 0.9 % IV SOLN: 500.0000 mL | Freq: Once | INTRAVENOUS | Status: AC

## 2024-05-25 NOTE — Telephone Encounter (Signed)
 error

## 2024-05-25 NOTE — Progress Notes (Signed)
 [facility] History and Physical   Primary Care Physician:  Jhon Elveria LABOR, MD   Reason for Procedure:   Atypical GERD symptoms  Plan:    EGD with Bravo probe placement     HPI: Wendy Elliott is a 73 y.o. female undergoing EGD with Bravo probe to further evaluate atypical GERD symptoms of dry mouth, throat irritation and dental erosions, not responsive to PPI.      Past Medical History:  Diagnosis Date   GERD (gastroesophageal reflux disease)    Hormone replacement therapy    Hypothyroidism    Insomnia    Sleep apnea     History reviewed. No pertinent surgical history.  Prior to Admission medications   Medication Sig Start Date End Date Taking? Authorizing Provider  Cholecalciferol (VITAMIN D3) 50 MCG (2000 UT) capsule Take 2,000 Units by mouth daily. 04/04/17  Yes [provider]  Ciclopirox 1 % shampoo Apply topically 2 (two) times daily. 05/22/21  Yes [provider]  cyanocobalamin (VITAMIN B12) 1000 MCG tablet Take 1,000 mcg by mouth daily. 04/04/17  Yes [provider]  ipratropium (ATROVENT) 0.06 % nasal spray Place 2 sprays into both nostrils 2 (two) times daily. 12/24/23  Yes [provider]  ketoconazole (NIZORAL) 2 % shampoo Apply topically 2 (two) times a week. 02/17/24  Yes [provider]  PRESCRIPTION MEDICATION 1 application  by Subdermal route every 4 (four) months. Hormone pellets with testosterone  and progesterone    Yes [provider]  clobetasol (TEMOVATE) 0.05 % external solution Apply 1 Application topically 2 (two) times daily.    [provider]  Estradiol  (ESTRACE  VA) Place vaginally.    [provider]  famotidine (PEPCID) 40 MG tablet Take 40 mg by mouth daily. Patient not taking: Reported on 05/25/2024 05/14/24   [provider]  fluconazole (DIFLUCAN) 150 MG tablet Take by mouth. Patient not taking: Reported on 05/25/2024 05/12/24   [provider]  latanoprost (XALATAN) 0.005 % ophthalmic solution Administer 1 drop into both eyes at bedtime. 07/16/20   [provider]  mycophenolate (CELLCEPT) 250 MG capsule TAKE 1 CAPSULE (250MG ) BY MOUTH 3 TIMES DAILY. 09/22/23   [provider]  Probiotic Product (PROBIOTIC PO) Take by mouth.    [provider]  thyroid  (ARMOUR THYROID ) 30 MG tablet Take 30 mg by mouth daily before breakfast. Patient not taking: Reported on 05/25/2024 12/29/21   [provider]  triamcinolone cream (KENALOG) 0.1 % APPLY ON THE SKIN TWICE A DAY APPLY TO EARS AS NEEDED ITCHING Patient not taking: Reported on 05/25/2024 05/02/21   [provider]    Current Outpatient Medications  Medication Sig Dispense Refill   Cholecalciferol (VITAMIN D3) 50 MCG (2000 UT) capsule Take 2,000 Units by mouth daily.     Ciclopirox 1 % shampoo Apply topically 2 (two) times daily.     cyanocobalamin (VITAMIN B12) 1000 MCG tablet Take 1,000 mcg by mouth daily.     ipratropium (ATROVENT) 0.06 % nasal spray Place 2 sprays into both nostrils 2 (two) times daily.     ketoconazole (NIZORAL) 2 % shampoo Apply topically 2 (two) times a week.     PRESCRIPTION MEDICATION 1 application  by Subdermal route every 4 (four) months. Hormone pellets with testosterone  and progesterone      clobetasol (TEMOVATE) 0.05 % external solution Apply 1 Application topically 2 (two) times daily.     Estradiol  (ESTRACE  VA) Place vaginally.     famotidine (PEPCID) 40 MG  tablet Take 40 mg by mouth daily. (Patient not taking: Reported on 05/25/2024)     fluconazole (DIFLUCAN) 150 MG tablet Take by mouth. (Patient not taking: Reported on 05/25/2024)     latanoprost (XALATAN) 0.005 % ophthalmic solution Administer 1 drop into both eyes at bedtime.     mycophenolate (CELLCEPT) 250 MG capsule TAKE 1 CAPSULE (250MG ) BY MOUTH 3 TIMES DAILY.     Probiotic Product (PROBIOTIC PO) Take by mouth.     thyroid  (ARMOUR THYROID ) 30 MG  tablet Take 30 mg by mouth daily before breakfast. (Patient not taking: Reported on 05/25/2024)     triamcinolone cream (KENALOG) 0.1 % APPLY ON THE SKIN TWICE A DAY APPLY TO EARS AS NEEDED ITCHING (Patient not taking: Reported on 05/25/2024)     Current Facility-Administered Medications  Medication Dose Route Frequency Provider Last Rate Last Admin   0.9 %  sodium chloride infusion  500 mL Intravenous Once Stacia Glendia BRAVO, MD        Allergies as of 05/25/2024   (No Known Allergies)    Family History  Problem Relation Age of Onset   Osteoporosis Mother    Diabetes Father    Dementia Sister     Social History   Socioeconomic History   Marital status: Single    Spouse name: Not on file   Number of children: 0   Years of education: 13   Highest education level: Not on file  Occupational History   Occupation: Self Employeed   Tobacco Use   Smoking status: Former    Passive exposure: Never   Smokeless tobacco: Never   Tobacco comments:    Quit about 2000  Vaping Use   Vaping status: Never Used  Substance and Sexual Activity   Alcohol use: Yes    Alcohol/week: 2.0 standard drinks of alcohol    Types: 2 Glasses of wine per week   Drug use: No   Sexual activity: Not on file  Other Topics Concern   Not on file  Social History Narrative   Rarely drinks caffeine    Social Drivers of Health   Financial Resource Strain: Not on file  Food Insecurity: Low Risk  (01/27/2024)   Received from [facility]   Hunger Vital Sign    Within the past 12 months, you worried that your food would run out before you got money to buy more: Never true    Within the past 12 months, the food you bought just didn't last and you didn't have money to get more. : Never true  Transportation Needs: No Transportation Needs (01/27/2024)   Received from [facility]    In the past 12 months, has lack of reliable transportation kept you from medical appointments, meetings, work or  from getting things needed for daily living? : No  Physical Activity: Not on file  Stress: Not on file  Social Connections: Not on file  Intimate Partner Violence: Not on file    Review of Systems:  All other review of systems negative except as mentioned in the HPI.  Physical Exam: Vital signs BP 128/71   Pulse 86   Temp 97.8 F (36.6 C) (Temporal)   Ht 5' 1 (1.549 m)   Wt 152 lb (68.9 kg)   SpO2 97%   BMI 28.72 kg/m   General:   Alert,  Well-developed, well-nourished, pleasant and cooperative in NAD Airway:  Mallampati 3 Lungs:  Clear throughout to auscultation.   Heart:  Regular  rate and rhythm; no murmurs, clicks, rubs,  or gallops. Abdomen:  Soft, nontender and nondistended. Normal bowel sounds.   Neuro/Psych:  Normal mood and affect. A and O x 3   Martese Vanatta E. Stacia, MD [facility]

## 2024-05-25 NOTE — Progress Notes (Signed)
 Called to room to assist during endoscopic procedure.  Patient ID and intended procedure confirmed with present staff. Received instructions for my participation in the procedure from the performing physician.

## 2024-05-25 NOTE — Op Note (Signed)
 [facility] Patient Name: Wendy Elliott Procedure Date: 05/25/2024 10:08 AM MRN: [id number] Endoscopist: Glendia E. Stacia , MD, [id number] Age: 73 Referring MD:  Date of Birth: May 30, 1951 Gender: Female Account #: [id number] Procedure:                Upper GI endoscopy Indications:              Exclusion of esophageal reflux, Globus sensation,                            xerostomia, dental erosions Medicines:                Monitored Anesthesia Care Procedure:                Pre-Anesthesia Assessment:                           - Prior to the procedure, a History and Physical                            was performed, and patient medications and                            allergies were reviewed. The patient's tolerance of                            previous anesthesia was also reviewed. The risks                            and benefits of the procedure and the sedation                            options and risks were discussed with the patient.                            All questions were answered, and informed consent                            was obtained. Prior Anticoagulants: The patient has                            taken no anticoagulant or antiplatelet agents. ASA                            Grade Assessment: II - A patient with mild systemic                            disease. After reviewing the risks and benefits,                            the patient was deemed in satisfactory condition to                            undergo the procedure.  After obtaining informed consent, the endoscope was                            passed under direct vision. Throughout the                            procedure, the patient's blood pressure, pulse, and                            oxygen saturations were monitored continuously. The                            Olympus Scope [id number] was introduced through the                            mouth, and  advanced to the second part of duodenum.                            The upper GI endoscopy was accomplished without                            difficulty. The patient tolerated the procedure                            well. Scope In: Scope Out: Findings:                 The examined portions of the nasopharynx,                            oropharynx and larynx were normal.                           A low-grade of narrowing Schatzki ring was found at                            the gastroesophageal junction.                           Esophagogastric landmarks were identified: the                            Z-line was found at 34 cm, the gastroesophageal                            junction was found at 34 cm and the site of hiatal                            narrowing was found at 36 cm from the incisors.                           The gastroesophageal flap valve was visualized                            endoscopically and classified as  Hill Grade IV (no                            fold, wide open lumen, hiatal hernia present).                           The exam of the esophagus was otherwise normal.                           A 2 cm hiatal hernia was present.                           The entire examined stomach was normal.                           The examined duodenum was normal.                           The BRAVO capsule with delivery system was                            introduced through the mouth and advanced into the                            esophagus, such that the BRAVO pH capsule was                            positioned 28 cm from the incisors, which was 6 cm                            proximal to the GE junction. The BRAVO pH capsule                            was then deployed and attached to the esophageal                            mucosa. The delivery system was then withdrawn.                            Endoscopy was utilized for placement of the probe                             only. Complications:            No immediate complications. Estimated Blood Loss:     Estimated blood loss was minimal. Impression:               - The examined portions of the nasopharynx,                            oropharynx and larynx were normal.                           - Low-grade of narrowing Schatzki ring.                           -  Esophagogastric landmarks identified.                           - 2 cm hiatal hernia.                           - Gastroesophageal flap valve classified as Hill                            Grade IV (no fold, wide open lumen, hiatal hernia                            present).                           - Normal stomach.                           - Normal examined duodenum.                           - No specimens collected. Recommendation:           - Patient has a contact number available for                            emergencies. The signs and symptoms of potential                            delayed complications were discussed with the                            patient. Return to normal activities tomorrow.                            Written discharge instructions were provided to the                            patient.                           - Follow dietary recommendations for Bravo probe                            capsule.                           - Continue present medications.                           - Further recommendations pending Bravo probe data                            review. Angi Goodell E. Stacia, MD 05/25/2024 10:36:48 AM This report has been signed electronically.

## 2024-05-25 NOTE — Patient Instructions (Addendum)
 -Follow dietary recommendations for Bravo probe capsule -Continue present medications -Handout on Bravo instructions provided  YOU HAD AN ENDOSCOPIC PROCEDURE TODAY AT THE [facility]:   Refer to the procedure report that was given to you for any specific questions about what was found during the examination.  If the procedure report does not answer your questions, please call your gastroenterologist to clarify.  If you requested that your care partner not be given the details of your procedure findings, then the procedure report has been included in a sealed envelope for you to review at your convenience later.  YOU SHOULD EXPECT: Some feelings of bloating in the abdomen. Passage of more gas than usual.  Walking can help get rid of the air that was put into your GI tract during the procedure and reduce the bloating. If you had a lower endoscopy (such as a colonoscopy or flexible sigmoidoscopy) you may notice spotting of blood in your stool or on the toilet paper. If you underwent a bowel prep for your procedure, you may not have a normal bowel movement for a few days.  Please Note:  You might notice some irritation and congestion in your nose or some drainage.  This is from the oxygen used during your procedure.  There is no need for concern and it should clear up in a day or so.  SYMPTOMS TO REPORT IMMEDIATELY:  Following upper endoscopy (EGD)  Vomiting of blood or coffee ground material  New chest pain or pain under the shoulder blades  Painful or persistently difficult swallowing  New shortness of breath  Fever of 100F or higher  Black, tarry-looking stools  For urgent or emergent issues, a gastroenterologist can be reached at any hour by calling [phone]. Do not use MyChart messaging for urgent concerns.    DIET:  We do recommend a small meal at first, but then you may proceed to your regular diet.  Drink plenty of fluids but you should avoid alcoholic beverages for  24 hours.  ACTIVITY:  You should plan to take it easy for the rest of today and you should NOT DRIVE or use heavy machinery until tomorrow (because of the sedation medicines used during the test).    FOLLOW UP: Our staff will call the number listed on your records the next business day following your procedure.  We will call around 7:15- 8:00 am to check on you and address any questions or concerns that you may have regarding the information given to you following your procedure. If we do not reach you, we will leave a message.     If any biopsies were taken you will be contacted by phone or by letter within the next 1-3 weeks.  Please call us  at [phone] if you have not heard about the biopsies in 3 weeks.    SIGNATURES/CONFIDENTIALITY: You and/or your care partner have signed paperwork which will be entered into your electronic medical record.  These signatures attest to the fact that that the information above on your After Visit Summary has been reviewed and is understood.  Full responsibility of the confidentiality of this discharge information lies with you and/or your care-partner.   Post-op Bravo pH instructions Once you get home:  Eat normally and go about your daily routine/activities Limit drinking fluids or eating between meals Do not chew gum or eat hard candy DO NOT take any antacid or anti-reflux medications during the 48-hour monitoring time, unless instructed by your physician  Recording  events: Events to be recorded are:  Record using event buttons on recorder and write on paper diary form Every time you eat or drink something (other than water) 2.   Periods of lying down/reclining 3.  Symptoms:  may include heartburn, regurgitation, chest pain, cough or specify if other.  A paper diary is also provided to record the times of your reflux symptoms and times for meals and when you lie down.  The recorder needs to remain within 3 feet (arms length) of you during the  testing period (48 hours). If you should forget and move outside of a 3-foot radius of the receiver you may hear beeping and you will see a "C1" error in the display window on the top of the receiver.  Please pick up the receiver and hold close to you to re-establish the connection and the error message disappears.  You may take a bath/shower during the testing period, but the recorder must not get wet and must remain within 3 feet of you. Please leave the receiver outside of the shower or tub while bathing. The monitoring period will be for 48 hours after placement of the capsule.  At the end of the 48 hours, you will return the recorder, and your diary, to our [facility] front desk.  A nurse will meet you to collect the device and answer any questions you may have.  The device should turn off once the 48 hours is complete.   What to expect after placement of the capsule:  Some patients experience a vague sensation that something is in their esophagus or that they 'feel' the capsule when they swallow food.  Should you experience this, chewing food carefully or drinking liquids may minimize this sensation.   After the test is complete, the disposable capsule will fall off the wall of your esophagus within 5-10 days and pass naturally with your bowel movement through the digestive tract.  Once the recorder is returned, your provider will review and interpret your recordings and contact you to discuss your results.  This may take up to two weeks.   DO NOT have an MRI for 30 days after your procedure to ensure the capsule is no longer inside your body  It is imperative that you return the recorder on _________________________ by 3:00pm.  Your information must be downloaded at this time to obtain your results.

## 2024-05-25 NOTE — Progress Notes (Signed)
 Report to PACU, RN, vss, BBS= Clear.

## 2024-05-25 NOTE — Progress Notes (Signed)
 Capsule expiration date- 09/11/2025  Capsule ID number #[id number]  LES measurement: 34 cm (capsule placed 6 cm above LES) 28 cm  Time of implant: 1026

## 2024-05-26 ENCOUNTER — Telehealth: Payer: Self-pay

## 2024-05-26 ENCOUNTER — Encounter

## 2024-05-26 NOTE — Telephone Encounter (Signed)
 Attempted to reach patient for follow up phone call. No answer, left voicemail to contact Dr. Milus Alpha office with any questions or concerns.

## 2024-05-27 ENCOUNTER — Encounter

## 2024-06-03 ENCOUNTER — Encounter: Payer: Self-pay | Admitting: Gastroenterology

## 2024-06-04 ENCOUNTER — Ambulatory Visit: Payer: Self-pay | Admitting: Gastroenterology
